# Patient Record
Sex: Female | Born: 1950
Health system: Southern US, Community
[De-identification: ages and names within clinical notes are randomized; demographics above are authoritative.]

## PROBLEM LIST (undated history)

## (undated) DIAGNOSIS — K649 Unspecified hemorrhoids: Secondary | ICD-10-CM

## (undated) DIAGNOSIS — K5790 Diverticulosis of intestine, part unspecified, without perforation or abscess without bleeding: Secondary | ICD-10-CM

## (undated) DIAGNOSIS — M5126 Other intervertebral disc displacement, lumbar region: Secondary | ICD-10-CM

## (undated) DIAGNOSIS — T7840XA Allergy, unspecified, initial encounter: Secondary | ICD-10-CM

## (undated) DIAGNOSIS — E559 Vitamin D deficiency, unspecified: Secondary | ICD-10-CM

## (undated) DIAGNOSIS — H269 Unspecified cataract: Secondary | ICD-10-CM

## (undated) DIAGNOSIS — K4090 Unilateral inguinal hernia, without obstruction or gangrene, not specified as recurrent: Secondary | ICD-10-CM

## (undated) DIAGNOSIS — F419 Anxiety disorder, unspecified: Secondary | ICD-10-CM

## (undated) DIAGNOSIS — IMO0002 Reserved for concepts with insufficient information to code with codable children: Secondary | ICD-10-CM

## (undated) DIAGNOSIS — R87619 Unspecified abnormal cytological findings in specimens from cervix uteri: Secondary | ICD-10-CM

## (undated) HISTORY — PX: BREAST ENHANCEMENT SURGERY: SHX7

## (undated) HISTORY — DX: Diverticulosis of intestine, part unspecified, without perforation or abscess without bleeding: K57.90

## (undated) HISTORY — PX: COLONOSCOPY: SHX174

## (undated) HISTORY — DX: Reserved for concepts with insufficient information to code with codable children: IMO0002

## (undated) HISTORY — PX: HEMORRHOID SURGERY: SHX153

## (undated) HISTORY — PX: INGUINAL HERNIA REPAIR: SUR1180

## (undated) HISTORY — DX: Unspecified abnormal cytological findings in specimens from cervix uteri: R87.619

## (undated) HISTORY — PX: CRYOTHERAPY: SHX1416

## (undated) HISTORY — PX: WISDOM TOOTH EXTRACTION: SHX21

## (undated) HISTORY — DX: Unspecified cataract: H26.9

## (undated) HISTORY — DX: Anxiety disorder, unspecified: F41.9

## (undated) HISTORY — DX: Other intervertebral disc displacement, lumbar region: M51.26

## (undated) HISTORY — PX: BUNIONECTOMY: SHX129

## (undated) HISTORY — DX: Vitamin D deficiency, unspecified: E55.9

## (undated) HISTORY — PX: TONSILLECTOMY: SUR1361

## (undated) HISTORY — DX: Allergy, unspecified, initial encounter: T78.40XA

---

## 1998-03-07 ENCOUNTER — Ambulatory Visit (HOSPITAL_BASED_OUTPATIENT_CLINIC_OR_DEPARTMENT_OTHER): Admission: RE | Admit: 1998-03-07 | Discharge: 1998-03-07 | Payer: Self-pay | Admitting: Orthopedic Surgery

## 1998-03-25 HISTORY — PX: BLEPHAROPLASTY: SUR158

## 2002-12-02 ENCOUNTER — Encounter (INDEPENDENT_AMBULATORY_CARE_PROVIDER_SITE_OTHER): Payer: Self-pay

## 2002-12-02 ENCOUNTER — Ambulatory Visit (HOSPITAL_COMMUNITY): Admission: RE | Admit: 2002-12-02 | Discharge: 2002-12-02 | Payer: Self-pay | Admitting: Surgery

## 2007-12-29 ENCOUNTER — Other Ambulatory Visit: Admission: RE | Admit: 2007-12-29 | Discharge: 2007-12-29 | Payer: Self-pay | Admitting: Obstetrics and Gynecology

## 2008-06-14 ENCOUNTER — Telehealth: Payer: Self-pay | Admitting: Internal Medicine

## 2008-06-14 DIAGNOSIS — K644 Residual hemorrhoidal skin tags: Secondary | ICD-10-CM | POA: Insufficient documentation

## 2008-06-23 ENCOUNTER — Encounter (INDEPENDENT_AMBULATORY_CARE_PROVIDER_SITE_OTHER): Payer: Self-pay | Admitting: General Surgery

## 2008-06-23 ENCOUNTER — Ambulatory Visit (HOSPITAL_BASED_OUTPATIENT_CLINIC_OR_DEPARTMENT_OTHER): Admission: RE | Admit: 2008-06-23 | Discharge: 2008-06-23 | Payer: Self-pay | Admitting: General Surgery

## 2008-06-24 ENCOUNTER — Ambulatory Visit (HOSPITAL_COMMUNITY): Admission: RE | Admit: 2008-06-24 | Discharge: 2008-06-24 | Payer: Self-pay | Admitting: General Surgery

## 2009-03-31 ENCOUNTER — Encounter (INDEPENDENT_AMBULATORY_CARE_PROVIDER_SITE_OTHER): Payer: Self-pay | Admitting: *Deleted

## 2009-05-26 ENCOUNTER — Encounter: Admission: RE | Admit: 2009-05-26 | Discharge: 2009-05-26 | Payer: Self-pay | Admitting: Chiropractic Medicine

## 2009-10-12 ENCOUNTER — Encounter: Payer: Self-pay | Admitting: Internal Medicine

## 2009-11-22 ENCOUNTER — Encounter (INDEPENDENT_AMBULATORY_CARE_PROVIDER_SITE_OTHER): Payer: Self-pay | Admitting: *Deleted

## 2009-11-22 ENCOUNTER — Ambulatory Visit: Payer: Self-pay | Admitting: Internal Medicine

## 2009-12-29 ENCOUNTER — Ambulatory Visit: Payer: Self-pay | Admitting: Internal Medicine

## 2010-04-24 NOTE — Letter (Signed)
Summary: Colonoscopy Letter  [facility]  [address] [place], [place] [address]   Phone: [phone]  Fax: [phone]      March 31, 2009 MRN: [id number]   [facility]  [address] [place], [place]  [address]   Dear Ms. Coulthard ,   According to your medical record, it is time for you to schedule a Colonoscopy. The American Cancer Society recommends this procedure as a method to detect early colon cancer. Patients with a family history of colon cancer, or a personal history of colon polyps or inflammatory bowel disease are at increased risk.  This letter has beeen generated based on the recommendations made at the time of your procedure. If you feel that in your particular situation this may no longer apply, please contact our office.  Please call our office at [phone] to schedule this appointment or to update your records at your earliest convenience.  Thank you for cooperating with [name] to provide you with the very best care possible.   Sincerely,  Hedwig Morton. Juanda Chance, M.D.  [facility] Gastroenterology Division [phone]

## 2010-04-24 NOTE — Miscellaneous (Signed)
Summary: [facility] Previsit/prep  Clinical Lists Changes  Medications: Added new medication of MOVIPREP 100 GM  SOLR (PEG-KCL-NACL-NASULF-NA ASC-C) As per prep instructions. - Signed Rx of MOVIPREP 100 GM  SOLR (PEG-KCL-NACL-NASULF-NA ASC-C) As per prep instructions.;  #1 x 0;  Signed;  Entered by: Wyona Almas RN;  Authorized by: Hart Carwin MD;  Method used: Electronically to [facility] # [phone]*, [address], [place], [place]  [address], Ph: [phone], Fax: [phone] Observations: Added new observation of NKA: T (11/22/2009 7:52)    Prescriptions: MOVIPREP 100 GM  SOLR (PEG-KCL-NACL-NASULF-NA ASC-C) As per prep instructions.  #1 x 0   Entered by:   Wyona Almas RN   Authorized by:   Hart Carwin MD   Signed by:   Wyona Almas RN on 11/22/2009   Method used:   Electronically to        [facility] # [phone]* (retail)       [address]       [place], [place]  [address]       Ph: [phone]       Fax: [phone]   RxID:   [id number]

## 2010-04-24 NOTE — Procedures (Signed)
Summary: Colonoscopy  Patient: [name] Note: All result statuses are Final unless otherwise noted.  Tests: (1) Colonoscopy (COL)   COL Colonoscopy           DONE     [facility]     [address]     [place], [place]  [address]           COLONOSCOPY PROCEDURE REPORT           PATIENT:  Tammy Porter, Tammy Porter  MR#:  [id number]     BIRTHDATE:  1950-08-19, 59 yrs. old  GENDER:  female     ENDOSCOPIST:  Iva Boop, MD, [facility]           PROCEDURE DATE:  12/29/2009     PROCEDURE:  Colonoscopy [address]     ASA CLASS:  Class I     INDICATIONS:  Routine Risk Screening     MEDICATIONS:   Fentanyl 50 mcg IV, Versed 5 mg           DESCRIPTION OF PROCEDURE:   After the risks benefits and     alternatives of the procedure were thoroughly explained, informed     consent was obtained.  Digital rectal exam was performed and     revealed no abnormalities.   The LB 180AL [id number] endoscope was     introduced through the anus and advanced to the cecum, which was     identified by both the appendix and ileocecal valve, without     limitations.  The quality of the prep was excellent, using     MoviPrep.  The instrument was then slowly withdrawn as the colon     was fully examined. Insertion: 5:08 minutes Withdrawal: 12:10     minutes     <<PROCEDUREIMAGES>>           FINDINGS:  Moderate diverticulosis was found in the sigmoid colon.     This was otherwise a normal examination of the colon.     Retroflexed views in the rectum revealed no abnormalities.    The     scope was then withdrawn from the patient and the procedure     completed.           COMPLICATIONS:  None     ENDOSCOPIC IMPRESSION:     1) Moderate diverticulosis in the sigmoid colon     2) Otherwise normal examination, excellent prep           REPEAT EXAM:  In 10 year(s) for routine screening colonoscopy.           Iva Boop, MD, [facility]           CC:  The Patient and Leota Sauers, NP [facility]      Healthcare)           n.     Rosalie Doctor:   Iva Boop at 12/29/2009 10:29 AM           Lavell Islam, [id number]  Note: An exclamation mark (!) indicates a result that was not dispersed into the flowsheet. Document Creation Date: 12/29/2009 10:30 AM _______________________________________________________________________  (1) Order result status: Final Collection or observation date-time: 12/29/2009 10:21 Requested date-time:  Receipt date-time:  Reported date-time:  Referring Physician:   Ordering Physician: Stan Head [phone]) Specimen Source:  Source: Launa Grill Order Number: [id number] Lab site:   Appended Document: Colonoscopy    Clinical Lists Changes  Observations: Added new observation of COLONNXTDUE: 12/2019 (12/29/2009 10:49)

## 2010-04-24 NOTE — Letter (Signed)
Summary: [facility] Instructions  [facility]  [address] [place], [place] [address]   Phone: [phone]  Fax: [phone]       Titiana Cambre    04-17-1950    MRN: [id number]        Procedure Day Dorna Bloom:  Farrell Ours  12/29/09     Arrival Time:  1:30PM     Procedure Time:  2:30PM     Location of Procedure:                    _X _  [facility] (4th Floor)                      PREPARATION FOR COLONOSCOPY WITH MOVIPREP   Starting 5 days prior to your procedure 12/24/09 do not eat nuts, seeds, popcorn, corn, beans, peas,  salads, or any raw vegetables.  Do not take any fiber supplements (e.g. Metamucil, Citrucel, and Benefiber).  THE DAY BEFORE YOUR PROCEDURE         DATE: 12/28/09 DAY: THURSDAY  1.  Drink clear liquids the entire day-NO SOLID FOOD  2.  Do not drink anything colored red or purple.  Avoid juices with pulp.  No orange juice.  3.  Drink at least 64 oz. (8 glasses) of fluid/clear liquids during the day to prevent dehydration and help the prep work efficiently.  CLEAR LIQUIDS INCLUDE: Water Jello Ice Popsicles Tea (sugar ok, no milk/cream) Powdered fruit flavored drinks Coffee (sugar ok, no milk/cream) Gatorade Juice: apple, white grape, white cranberry  Lemonade Clear bullion, consomm, broth Carbonated beverages (any kind) Strained chicken noodle soup Hard Candy                             4.  In the morning, mix first dose of MoviPrep solution:    Empty 1 Pouch A and 1 Pouch B into the disposable container    Add lukewarm drinking water to the top line of the container. Mix to dissolve    Refrigerate (mixed solution should be used within 24 hrs)  5.  Begin drinking the prep at 5:00 p.m. The MoviPrep container is divided by 4 marks.   Every 15 minutes drink the solution down to the next mark (approximately 8 oz) until the full liter is complete.   6.  Follow completed prep with 16 oz of clear liquid of your choice (Nothing red  or purple).  Continue to drink clear liquids until bedtime.  7.  Before going to bed, mix second dose of MoviPrep solution:    Empty 1 Pouch A and 1 Pouch B into the disposable container    Add lukewarm drinking water to the top line of the container. Mix to dissolve    Refrigerate  THE DAY OF YOUR PROCEDURE      DATE: 12/29/09  DAY: FRIDAY  Beginning at 9:30AM (5 hours before procedure):         1. Every 15 minutes, drink the solution down to the next mark (approx 8 oz) until the full liter is complete.  2. Follow completed prep with 16 oz. of clear liquid of your choice.    3. You may drink clear liquids until 12:30PM (2 HOURS BEFORE PROCEDURE).   MEDICATION INSTRUCTIONS  Unless otherwise instructed, you should take regular prescription medications with a small sip of water   as early as possible the morning of your procedure.  OTHER INSTRUCTIONS  You will need a responsible adult at least 62 years of age to accompany you and drive you home.   This person must remain in the waiting room during your procedure.  Wear loose fitting clothing that is easily removed.  Leave jewelry and other valuables at home.  However, you may wish to bring a book to read or  an iPod/MP3 player to listen to music as you wait for your procedure to start.  Remove all body piercing jewelry and leave at home.  Total time from sign-in until discharge is approximately 2-3 hours.  You should go home directly after your procedure and rest.  You can resume normal activities the  day after your procedure.  The day of your procedure you should not:   Drive   Make legal decisions   Operate machinery   Drink alcohol   Return to work  You will receive specific instructions about eating, activities and medications before you leave.    The above instructions have been reviewed and explained to me by   .sign    I fully understand and can verbalize these instructions  _____________________________ Date _________

## 2010-04-24 NOTE — Letter (Signed)
Summary: Previsit letter  [facility]  [address] [place], [place] [address]   Phone: [phone]  Fax: [phone]       10/12/2009 MRN: [id number]  Tammy Porter [address] [place], [place]  [address]  [place]  Dear Ms. Blough,  Welcome to the Gastroenterology Division at [facility]    You are scheduled to see a nurse for your pre-procedure visit on 11-21-09 at 11AM on the 3rd floor at [facility], [address]  We ask that you try to arrive at our office 15 minutes prior to your appointment time to allow for check-in.  Your nurse visit will consist of discussing your medical and surgical history, your immediate family medical history, and your medications.    Please bring a complete list of all your medications or, if you prefer, bring the medication bottles and we will list them.  We will need to be aware of both prescribed and over the counter drugs.  We will need to know exact dosage information as well.  If you are on blood thinners (Coumadin, Plavix, Aggrenox, Ticlid, etc.) please call our office today/prior to your appointment, as we need to consult with your physician about holding your medication.   Please be prepared to read and sign documents such as consent forms, a financial agreement, and acknowledgement forms.  If necessary, and with your consent, a friend or relative is welcome to sit-in on the nurse visit with you.  Please bring your insurance card so that we may make a copy of it.  If your insurance requires a referral to see a specialist, please bring your referral form from your primary care physician.  No co-pay is required for this nurse visit.     If you cannot keep your appointment, please call [phone] to cancel or reschedule prior to your appointment date.  This allows [name] the opportunity to schedule an appointment for another patient in need of care.    Thank you for choosing [facility] for your medical needs.   We appreciate the opportunity to care for you.  Please visit [name] at our website  to learn more about our practice.                     Sincerely.                                                                                                                   The Gastroenterology Division

## 2010-07-05 LAB — DIFFERENTIAL
Basophils Absolute: 0 10*3/uL (ref 0.0–0.1)
Eosinophils Absolute: 0 10*3/uL (ref 0.0–0.7)
Monocytes Relative: 12 % (ref 3–12)

## 2010-07-05 LAB — CBC
HCT: 43.1 % (ref 36.0–46.0)
MCHC: 34 g/dL (ref 30.0–36.0)
Platelets: 238 10*3/uL (ref 150–400)
RDW: 12.5 % (ref 11.5–15.5)

## 2010-07-05 LAB — COMPREHENSIVE METABOLIC PANEL
AST: 23 U/L (ref 0–37)
BUN: 10 mg/dL (ref 6–23)
Calcium: 9.3 mg/dL (ref 8.4–10.5)
Chloride: 101 mEq/L (ref 96–112)
Creatinine, Ser: 0.73 mg/dL (ref 0.4–1.2)
GFR calc non Af Amer: >60 mL/min (ref >60)
Glucose, Bld: 102 mg/dL — ABNORMAL HIGH (ref 70–99)
Sodium: 141 mEq/L (ref 135–145)

## 2010-08-07 NOTE — Op Note (Signed)
NAMEEmmarose, Tammy Porter                  ACCOUNT NO.:  [id number]   MEDICAL RECORD NO.:  [id number]          PATIENT TYPE:  AMB   LOCATION:  [facility]                         FACILITY:  [facility]   PHYSICIAN:  Anselm Pancoast. Weatherly, M.D.DATE OF BIRTH:  16-Apr-1950   DATE OF PROCEDURE:  06/23/2008  DATE OF DISCHARGE:                               OPERATIVE REPORT   PREOPERATIVE DIAGNOSIS:  Internal and external hemorrhoids, significant.   OPERATION:  Internal and external hemorrhoidectomy, general anesthesia,  lithotomy position.   SURGEON:  Anselm Pancoast. Zachery Dakins, M.D.   HISTORY:  Tammy Porter is a 60 year old Caucasian female who is quite  physically active.  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  She said she has had problems with hemorrhoids nearly all her life.  Actually had a formal hemorrhoidectomy when she was about 60 years old  and then has had problems with bleeding, swelling, etc., and she was  referred over to our office by Dr. Lina Sar.  Patient had a  colonoscopy probably about 4 to 5 years ago that showed no evidence of  any abnormality, and Dr. Juanda Chance felt that she does not need a repeat  colonoscopy for about 5 years.  She has significant hemorrhoids,  however, in all quadrants, and when she strains, she prolapses.  She  uses an estrogen replacement and I recommended that we do a formal  internal/external hemorrhoidectomy.  She can tighten up on her  sphincter, but I was impressed with the degree of external hemorrhoids,  in addition to that when she strains, she prolapsed internal hemorrhoids  in all 3 quadrants.   As far as on prep, she had a bottle of mag citrate yesterday afternoon  and said she was perfectly cleaned out.  Later when I did her procto, I  agree she was very cleaned out.  The patient was taken back by the  anesthesiologist, endotracheal tube, positioned with hip in the  Yellofin  stirrups and slipped down on the table so her buttocks is easily  visualized.  In this position, she has hemorrhoid engorgement in all 3  quadrants, prolapsing in all 3 quadrants, and I prepped her after the  procto to 20 cm.  I saw no abnormalities with the exception of her  hemorrhoids, and then we draped her in a sterile manner.   First, I anesthetized the perianal area of about 20 cc of 0.5% Marcaine  with adrenaline and then draped her in a sterile manner.  The anterior  quadrant was the smaller of the 3, and I elevated the external area off  the sphincter, kind of elevated the internal hemorrhoid off the  sphincter and then used a Bowie clamp under it.  The little edges of the  external area were sutured with 3-0 chromic, and then under the Bowie  clamp, I sutured with 2-0 chromic a couple of U stitches, and then  starting at the apex, suture-tied the superior hemorrhoid and removed  the clamp and then  sutured the little suture line of a running 2-0  chromic.  The inner anal area actually closed with a running 2-0, and  then I sort of T'd the external portion so that the suture line does not  go up laterally, since she has a very thin perineal body area.  I put a  few little sutures of 3-0 chromic, going more anteriorly in addition to  the running suture that kind of goes a little more to the anterolateral  area.   Next, the larger of the hemorrhoids was the right posterolateral, and  this, the external hemorrhoid was relieved.  The hemostasis was a little  bit with cautery and then suturing with a 3-0 chromic.  Then the  hemorrhoid was elevated off of the internal sphincter area and then  underclamped with a Bowie.  Then I gained about 3 sutures, U stitches of  2-0 chromic, and then suturing starting at the apex with a running 2-0  chromic for good hemostasis and closure of the suture line.  This area,  I continued running the suture right on out externally with the  2-0  chromic.  Next, the left lateral, which was also a large one, was  treated in a similar manner, removing the external hemorrhoid, suturing  the external hemorrhoids with 3-0, elevating it from the sphincter, then  under-clamping with the Bowie, removing the hemorrhoids, and then the U  stitches of 2-0 chromic and then starting at the apex, closing the  suture line with a running 2-0 chromic.  The external portion was  closed.  I kind of T'd this again like I did the anterior one, and put  an extra few little sutures.  I inspected all suture lines.  I had  placed an little extra figure-of-eight suture internally on the left  lateral.  It was one of the largest, if not the largest.  After the  suture lines had been inspected with an anoscope, I put 5% Xylocaine  ointment within the anal canal, and opened a 4 x 4, and there was about  a half of a gauze 4 x 4  that is coming out of the anus to kind of hold  the Xylocaine ointment in immediately postoperatively.   I checked into the vagina at the completion, and again, she really has a  very minimal perineal body.  I think she has had 2 children.  Whether  she had a significant episiotomy or exactly why there was so little bulk  there, I am not sure.  I am sure that has been contributing to her  problems with kind of a lifelong hemorrhoids.  The procedure terminated.  We put 4x4's and an ABD and the little stretch panties on.   I talked with her friend, Weston Anna, who is going to be with her  tonight, about soaking in the bathtub this evening, removing the little  gauze, and then using the Xylocaine ointment plus pain medication.  I  recommended liquids tonight.  She can have real food tomorrow.  She is  so clean from her bowel prep, I do not think it is necessary to start  the laxatives until probably Sunday or at least Saturday p.m.      Anselm Pancoast. Zachery Dakins, M.D.  Electronically Signed     WJW/MEDQ  D:  06/23/2008  T:   06/23/2008  Job:  [id number]   cc:   Hedwig Morton. Juanda Chance, MD  [address]  [place]  [place] [address]

## 2010-08-10 NOTE — Op Note (Signed)
NAMEJamyrah, Tammy Porter                              ACCOUNT NO.:  [id number]   MEDICAL RECORD NO.:  [id number]                   PATIENT TYPE:  AMB   LOCATION:  DAY                                  FACILITY:  [facility]   PHYSICIAN:  Sandria Bales. Ezzard Standing, M.D.               DATE OF BIRTH:  06-May-1950   DATE OF PROCEDURE:  12/02/2002  DATE OF DISCHARGE:                                 OPERATIVE REPORT   PREOPERATIVE DIAGNOSIS:  Left inguinal hernia.   POSTOPERATIVE DIAGNOSIS:  Indirect left inguinal hernia.   PROCEDURE:  Laparoscopic converted to open left inguinal hernia repair with  mesh.   SURGEON:  Sandria Bales. Ezzard Standing, M.D.   ANESTHESIA:  General endotracheal.   ESTIMATED BLOOD LOSS:  30 mL   DRAINS:  None.   INDICATIONS FOR PROCEDURE:  Tammy Porter is a 60 year old white female who has  a symptomatic left inguinal hernia and comes for repair of this hernia. I  discussed with her the indications and potential complications of  laparoscopic hernia repair. The potential complications included but are not  limited to bleeding, infection, nerve injury, recurrence of the hernia and  the possibility of converting to an open surgery.   DESCRIPTION OF PROCEDURE:  The patient placed in the supine position, both  her arms tucked, Foley catheter in place. She is given a general  endotracheal anesthetic supervised by Jenelle Mages. Fortune, M.D. and 1 g of  Ancef at the initiation of the procedure. Her lower abdomen is prepped with  Betadine solution and sterilely draped. I started with an infraumbilical  incision made with sharp dissection and carried down to the rectus abdominis  fascia. I went to the left of the rectus abdominis muscle, went through the  anterior fascia, retracted the rectus abdominis muscle anteriorly and placed  the preperitoneal balloon into the preperitoneal space and insufflated this  under direct visualization. Insufflating the balloon, there was a tear along  the left side of  the peritoneum. When I removed the balloon, she had a rent  which was probably about 7 or 8 cm in length. I thought it was impractical  to try to repair this and proceed with a laparoscopic hernia but I did not  get a good balloon distention and that she would be best served by  proceeding with just an open inguinal hernia repair. I was able to pass the  scope into the peritoneal cavity. I wanted to make sure there was no other  bowel injury or other problems. I then withdrew the scope and started a left  inguinal incision.   The left inguinal incision went down to the external oblique fascia which I  opened. I identified what looked like an ilioinguinal nerve which I spared  during the dissection. She had a sac which protruded out 7 or 8 cm, I took  the round ligament, removed the sac.  I opened the sac and introduced my  finger into the peritoneal cavity. I tried to fill this rent just to feel  what it felt like. Again I thought it was impractical to try to close this  rent. I really think this will stick down without any trouble. I then  ligated the sac with a #0 chromic suture. I also ligated the round ligament  with a #0 chromic suture. I then cleaned off the inguinal floor and sewed in  a piece of mesh medially to the pubic tubercle, inferiorly to the shelving  edge of the inguinal ligaments and superiorly to transversalis fascia.   The patch that I put on was approximately 2 1/2 x 4 1/2 or 5 inches in size  and covered the inguinal floor completely. I then irrigated the wound and  closed the external oblique with interrupted 3-0 Vicryl sutures, irrigated  the wound and closed it the subcutaneous area with 3-0 Vicryl suture, the  skin with a 5-0 Vicryl suture. I closed the umbilicus, the anterior defect  of the fascia with a #0 Vicryl suture and the skin with a 5-0 Vicryl suture,  painted both wounds tincture of Benzoin and Steri-Strips.   The patient tolerated the procedure well  and was transported to the recovery  room in good condition. Sponge and needle count were correct at the end of  the case.                                               Sandria Bales. Ezzard Standing, M.D.    DHN/MEDQ  D:  12/02/2002  T:  12/02/2002  Job:  [id number]   cc:   Rosalyn Gess. Norins, M.D. [facility]   Willa Rough, M.D.

## 2011-07-15 ENCOUNTER — Ambulatory Visit: Payer: Self-pay | Admitting: Internal Medicine

## 2011-10-05 ENCOUNTER — Emergency Department (HOSPITAL_COMMUNITY): Payer: BC Managed Care – PPO

## 2011-10-05 ENCOUNTER — Encounter (HOSPITAL_COMMUNITY): Payer: Self-pay

## 2011-10-05 ENCOUNTER — Emergency Department (HOSPITAL_COMMUNITY)
Admission: EM | Admit: 2011-10-05 | Discharge: 2011-10-05 | Disposition: A | Discharge status: Home / Self Care | Payer: BC Managed Care – PPO | Attending: Emergency Medicine | Admitting: Emergency Medicine

## 2011-10-05 DIAGNOSIS — R112 Nausea with vomiting, unspecified: Secondary | ICD-10-CM | POA: Insufficient documentation

## 2011-10-05 DIAGNOSIS — R1013 Epigastric pain: Secondary | ICD-10-CM

## 2011-10-05 DIAGNOSIS — R079 Chest pain, unspecified: Secondary | ICD-10-CM | POA: Insufficient documentation

## 2011-10-05 HISTORY — DX: Unilateral inguinal hernia, without obstruction or gangrene, not specified as recurrent: K40.90

## 2011-10-05 HISTORY — DX: Unspecified hemorrhoids: K64.9

## 2011-10-05 LAB — COMPREHENSIVE METABOLIC PANEL
Albumin: 4.2 g/dL (ref 3.5–5.2)
Alkaline Phosphatase: 47 U/L (ref 39–117)
BUN: 13 mg/dL (ref 6–23)
Calcium: 9.2 mg/dL (ref 8.4–10.5)
Chloride: 98 mEq/L (ref 96–112)
Creatinine, Ser: 0.66 mg/dL (ref 0.50–1.10)
Glucose, Bld: 133 mg/dL — ABNORMAL HIGH (ref 70–99)
Sodium: 134 mEq/L — ABNORMAL LOW (ref 135–145)
Total Protein: 7 g/dL (ref 6.0–8.3)

## 2011-10-05 LAB — CBC WITH DIFFERENTIAL/PLATELET
Basophils Absolute: 0 K/uL (ref 0.0–0.1)
Basophils Relative: 0 % (ref 0–1)
Eosinophils Absolute: 0 10*3/uL (ref 0.0–0.7)
Eosinophils Relative: 0 % (ref 0–5)
HCT: 44.2 % (ref 36.0–46.0)
Hemoglobin: 15.7 g/dL — ABNORMAL HIGH (ref 12.0–15.0)
Lymphocytes Relative: 9 % — ABNORMAL LOW (ref 12–46)
Lymphs Abs: 0.7 K/uL (ref 0.7–4.0)
MCH: 32.7 pg (ref 26.0–34.0)
MCHC: 35.5 g/dL (ref 30.0–36.0)
MCV: 92.1 fL (ref 78.0–100.0)
Monocytes Absolute: 0.4 10*3/uL (ref 0.1–1.0)
Monocytes Relative: 5 % (ref 3–12)
Neutro Abs: 6.9 10*3/uL (ref 1.7–7.7)
Neutrophils Relative %: 85 % — ABNORMAL HIGH (ref 43–77)
Platelets: 201 10*3/uL (ref 150–400)
RBC: 4.8 MIL/uL (ref 3.87–5.11)
RDW: 12.5 % (ref 11.5–15.5)
WBC: 8.1 K/uL (ref 4.0–10.5)

## 2011-10-05 LAB — COMPREHENSIVE METABOLIC PANEL WITH GFR
ALT: 10 U/L (ref 0–35)
AST: 23 U/L (ref 0–37)
CO2: 24 meq/L (ref 19–32)
GFR calc Af Amer: >90 mL/min (ref >90)
GFR calc non Af Amer: >90 mL/min (ref >90)
Potassium: 4 meq/L (ref 3.5–5.1)
Total Bilirubin: 0.7 mg/dL (ref 0.3–1.2)

## 2011-10-05 LAB — LIPASE, BLOOD: Lipase: 73 U/L — ABNORMAL HIGH (ref 11–59)

## 2011-10-05 LAB — TROPONIN I: Troponin I: <0.3 ng/mL (ref <0.30)

## 2011-10-05 MED ORDER — PANTOPRAZOLE SODIUM 40 MG IV SOLR
40.0000 mg | Freq: Once | INTRAVENOUS | Status: AC
  Administered 2011-10-05: 40 mg via INTRAVENOUS
  Filled 2011-10-05: qty 40

## 2011-10-05 MED ORDER — ONDANSETRON HCL 4 MG/2ML IJ SOLN
4.0000 mg | Freq: Once | INTRAMUSCULAR | Status: AC
  Administered 2011-10-05: 4 mg via INTRAVENOUS
  Filled 2011-10-05: qty 2

## 2011-10-05 MED ORDER — HYDROMORPHONE HCL PF 1 MG/ML IJ SOLN
1.0000 mg | Freq: Once | INTRAMUSCULAR | Status: AC
  Administered 2011-10-05: 1 mg via INTRAVENOUS
  Filled 2011-10-05: qty 1

## 2011-10-05 MED ORDER — SODIUM CHLORIDE 0.9 % IV BOLUS (SEPSIS)
500.0000 mL | Freq: Once | INTRAVENOUS | Status: AC
  Administered 2011-10-05: 10:00:00 via INTRAVENOUS

## 2011-10-05 NOTE — ED Notes (Signed)
Dr. Oletta Lamas back at bedside.

## 2011-10-05 NOTE — ED Notes (Signed)
Pt dropping sats to lower 90s after receiving Dilaudid. Placed on 2 L/[place] oxygen

## 2011-10-05 NOTE — ED Notes (Addendum)
Pt sts she woke up at 0300 with sharp, stabbing pain to her left upper quadrant. Vomited with pain. Denies any previous c/o chest pain.

## 2011-10-05 NOTE — Discharge Instructions (Signed)
 Abdominal Pain Abdominal pain can be caused by many things. Your caregiver decides the seriousness of your pain by an examination and possibly blood tests and X-rays. Many cases can be observed and treated at home. Most abdominal pain is not caused by a disease and will probably improve without treatment. However, in many cases, more time must pass before a clear cause of the pain can be found. Before that point, it may not be known if you need more testing, or if hospitalization or surgery is needed. HOME CARE INSTRUCTIONS   Do not take laxatives unless directed by your caregiver.   Take pain medicine only as directed by your caregiver.   Only take over-the-counter or prescription medicines for pain, discomfort, or fever as directed by your caregiver.   Try a clear liquid diet (broth, tea, or water) for as long as directed by your caregiver. Slowly move to a bland diet as tolerated.  SEEK IMMEDIATE MEDICAL CARE IF:   The pain does not go away.   You have a fever.   You keep throwing up (vomiting).   The pain is felt only in portions of the abdomen. Pain in the right side could possibly be appendicitis. In an adult, pain in the left lower portion of the abdomen could be colitis or diverticulitis.   You pass bloody or black tarry stools.  MAKE SURE YOU:   Understand these instructions.   Will watch your condition.   Will get help right away if you are not doing well or get worse.  Document Released: 12/19/2004 Document Revised: 02/28/2011 Document Reviewed: 10/28/2007 [facility] Patient Information 2012 [place], [place].

## 2011-10-05 NOTE — ED Provider Notes (Addendum)
History     CSN: [id number]  Arrival date & time 10/05/11  0934   First MD Initiated Contact with Patient 10/05/11 [phone]      Chief Complaint  Patient presents with  . Chest Pain    (Consider location/radiation/quality/duration/timing/severity/associated sxs/prior treatment) HPI Comments: Pt woke around 0300 with nausea, has had 3 episodes of vomiting, has now severe pain in upper epigastrium underneath her sternum that is reproduced with palpation or sudden movement.  She denies CP, SOB, pleurisy.  She and friends went out to eat dinner last night, she had seafood.  She went to urgent care this AM and told to come here.  She denies diarrhea, black stools.  Pain doesn't radiate to back, flank, chest.  No dysuria.  No leg problems.  She has h/o inguinal repair 10 years ago and hemorrhoidectomy about 4 years ago, otherwise is healthy.  She drinks 1-2 drinks daily and takes an aspirin or ibuprofen for arthritis approx daily.  At urgent care, her BP was high and HR was low.  Pt teaches pilates and yoga.    Patient is a 61 y.o. female presenting with chest pain. The history is provided by the patient.  Chest Pain Primary symptoms include abdominal pain, nausea and vomiting. Pertinent negatives for primary symptoms include no fever and no shortness of breath.     Past Medical History  Diagnosis Date  . Hemorrhoids   . Inguinal hernia     Past Surgical History  Procedure Date  . Hemorrhoid surgery     History reviewed. No pertinent family history.  History  Substance Use Topics  . Smoking status: Never Smoker   . Smokeless tobacco: Not on file  . Alcohol Use: Yes     occasionally    OB History    Grav Para Term Preterm Abortions TAB SAB Ect Mult Living                  Review of Systems  Constitutional: Negative for fever and chills.  Respiratory: Negative for shortness of breath.   Cardiovascular: Negative for chest pain.  Gastrointestinal: Positive for nausea,  vomiting and abdominal pain.  Musculoskeletal: Negative for back pain.  All other systems reviewed and are negative.    Allergies  Review of patient's allergies indicates no known allergies.  Home Medications   Current Outpatient Rx  Name Route Sig Dispense Refill  . CALCIUM CARBONATE PO Oral Take 1 tablet by mouth daily.    Marland Kitchen VITAMIN D PO Oral Take 1 tablet by mouth daily.    . OMEGA-3 FATTY ACIDS 1000 MG PO CAPS Oral Take 1 g by mouth daily.    . ADULT MULTIVITAMIN W/MINERALS CH Oral Take 1 tablet by mouth daily.    Marland Kitchen OVER THE COUNTER MEDICATION  Patient takes several other supplements, is unaware of names. Took all supplements yesterday.    Marland Kitchen PRESCRIPTION MEDICATION Oral Take 1 capsule by mouth daily. Special compound of progesterone and estriadol.      BP 122/68  Pulse 60  Temp 97.6 F (36.4 C) (Oral)  Resp 14  SpO2 100%  Physical Exam  Nursing note and vitals reviewed. Constitutional: She is oriented to person, place, and time. She appears well-developed and well-nourished.  HENT:  Head: Normocephalic and atraumatic.  Eyes: No scleral icterus.  Neck: Normal range of motion. Neck supple.  Cardiovascular: Regular rhythm, S1 normal and S2 normal.  Bradycardia present.  PMI is not displaced.   Pulmonary/Chest: Effort normal. No  respiratory distress. She has no wheezes. She has no rales.  Abdominal: Soft. Normal appearance. Bowel sounds are decreased. There is tenderness in the epigastric area. There is rebound and guarding. There is no CVA tenderness.  Neurological: She is alert and oriented to person, place, and time. She has normal strength. GCS eye subscore is 4. GCS verbal subscore is 5. GCS motor subscore is 6.  Skin: Skin is warm and dry. No rash noted. She is not diaphoretic. No pallor.  Psychiatric: Thought content normal. Her mood appears anxious. Cognition and memory are normal.    ED Course  Procedures (including critical care time)  Labs Reviewed  CBC WITH  DIFFERENTIAL - Abnormal; Notable for the following:    Hemoglobin 15.7 (*)     Neutrophils Relative 85 (*)     Lymphocytes Relative 9 (*)     All other components within normal limits  COMPREHENSIVE METABOLIC PANEL - Abnormal; Notable for the following:    Sodium 134 (*)     Glucose, Bld 133 (*)     All other components within normal limits  LIPASE, BLOOD - Abnormal; Notable for the following:    Lipase 73 (*)     All other components within normal limits  TROPONIN I  URINALYSIS, ROUTINE W REFLEX MICROSCOPIC   Dg Chest 2 View  10/05/2011  *RADIOLOGY REPORT*  Clinical Data: Epigastric pain.  CHEST - 2 VIEW  Comparison: None.  Findings: Mild cardiomegaly noted.  Mild scarring is seen at the lung bases.  No evidence of pulmonary infiltrate or edema.  No evidence of pleural effusion.  No mass or lymphadenopathy identified.  No evidence of free intraperitoneal on this exam.  the  IMPRESSION:  1.  Mild cardiomegaly and bibasilar scarring. 2.  No active lung disease or free intraperitoneal air.  Original Report Authenticated By: Danae Orleans, M.D.   US Abdomen Complete  10/05/2011  *RADIOLOGY REPORT*  Clinical Data:  Abdominal pain.  Nausea and vomiting.  ABDOMINAL ULTRASOUND COMPLETE  Comparison:  None.  Findings:  Gallbladder:  No gallstones, gallbladder wall thickening, or pericholecystic fluid.  Common Bile Duct:  Within normal limits in caliber. Measures 5 mm in diameter.  Liver: No focal mass lesion identified.  Within normal limits in parenchymal echogenicity.  IVC:  Appears normal.  Pancreas:  No abnormality identified.  Spleen:  Within normal limits in size and echotexture.  Right kidney:  Normal in size and parenchymal echogenicity.  No evidence of mass or hydronephrosis.  Left kidney:  Normal in size and parenchymal echogenicity.  No evidence of mass or hydronephrosis.  Abdominal Aorta:  No aneurysm identified.  Other:  Tiny left pleural effusion incidentally noted.  IMPRESSION:  1.  Negative  abdominal ultrasound. 2.  Tiny left pleural effusion incidentally noted.  Original Report Authenticated By: Danae Orleans, M.D.     1. Acute epigastric pain     RA sat is 100% and I interpret to be normal.  ECG at time 0944 shows sinus bradycardia at rate 49, poor r wave progression in V1-V3, borderline.  Very similar including rate to ECG from 12/02/02.   12:20 PM Pt's pain has completely resolved.  Lipase is minimally elevated, but not significantly, I think may be non specific or may be related to a biliary stone that has happened to pass.  She has no guard or rebound now, I think dilaudid would have worn off by now.  I have encouraged her to follow up with her PCP  for further discussion.  Will try PO's and if she can tolerate, will d/c home.  PT informed of marginally elevated lipase.  MDM  Pt is in sig pain, guarding and mild rebound tenderness.  Will get labs, CXR to r/o free air.  Will get U/S of RUQ, but CT may be needed as well.  IVF's, keep NPO, IV analgesics and antiemetics and will continue to monitor.          Gavin Pound. Oletta Lamas, MD 10/05/11 1223  Gavin Pound. Samarie Pinder, MD 10/05/11 1256

## 2011-10-05 NOTE — ED Notes (Signed)
MD back at bedside.

## 2011-10-05 NOTE — ED Notes (Signed)
Patient is NAD, denies any pain at present.

## 2011-10-05 NOTE — ED Notes (Signed)
Patient transported to Ultrasound by tech

## 2011-10-05 NOTE — ED Notes (Signed)
MD at bedside. 

## 2011-10-07 ENCOUNTER — Encounter: Payer: Self-pay | Admitting: *Deleted

## 2011-10-07 ENCOUNTER — Telehealth: Payer: Self-pay | Admitting: Internal Medicine

## 2011-10-07 ENCOUNTER — Encounter: Payer: Self-pay | Admitting: Internal Medicine

## 2011-10-07 NOTE — Telephone Encounter (Signed)
Patient has history with DB.  She will come in and see her tomorrow at 1:30

## 2011-10-08 ENCOUNTER — Ambulatory Visit (INDEPENDENT_AMBULATORY_CARE_PROVIDER_SITE_OTHER): Payer: BC Managed Care – PPO | Admitting: Internal Medicine

## 2011-10-08 ENCOUNTER — Encounter: Payer: Self-pay | Admitting: Internal Medicine

## 2011-10-08 VITALS — BP 100/60 | HR 60 | Ht 65.0 in | Wt 129.6 lb

## 2011-10-08 DIAGNOSIS — R1013 Epigastric pain: Secondary | ICD-10-CM

## 2011-10-08 NOTE — Patient Instructions (Addendum)
Follow up with Dr Juanda Chance as needed. CC: Dr Thayer Headings

## 2011-10-08 NOTE — Progress Notes (Signed)
Tammy Porter [birth year] MRN [id number]   History of Present Illness:  This is a 61 year old white female with episodes of severe subxiphoid chest pain which woke her up 3 days ago early in the morning and persisted. She went to the emergency room where she was given pain medication. Her cardiac evaluation was negative and her upper abdominal ultrasound was normal showing a normal common bile duct, gallbladder and small left pleural effusion. Her liver function tests were normal. Her lipase was slightly elevated at 73. She was treated in the emergency room and sent home but the pain recurred several hours later and she took a Zantac and Pepto-Bismol. She saw Dr Ronne Binning the next day and was started on Prilosec 20 mg twice a day with almost instant relief of the chest pain. She is feeling well today. She denies any pain, fever or nausea. 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 She is also taking Aleve. She has never had a similar pain before and there is no family history of gallbladder disease. She had a screening colonoscopy in 2001 and most recently in 2011 which showed moderately severe diverticulosis of the left colon.  Past Medical History  Diagnosis Date  . Hemorrhoids   . Inguinal hernia   . Vitamin d deficiency   . DDD (degenerative disc disease)    Past Surgical History  Procedure Date  . Hemorrhoid surgery   . Tonsillectomy   . Breast enhancement surgery     x 2  . Bunionectomy     right foot  . Blepharoplasty     reports that she has never smoked. She has never used smokeless tobacco. She reports that she drinks alcohol. She reports that she does not use illicit drugs. family history includes Leukemia in her father.  There is no history of Colon cancer. No Known Allergies      Review of Systems: Negative for dysphagia, odynophagia, history of reflux  The remainder of the 10 point ROS is negative except as outlined in  H&P   Physical Exam: General appearance  Well developed, in no distress. Eyes- non icteric. HEENT nontraumatic, normocephalic. Mouth no lesions, tongue papillated, no cheilosis. Neck supple without adenopathy, thyroid not enlarged, no carotid bruits, no JVD. Lungs Clear to auscultation bilaterally. Cor normal S1, normal S2, regular rhythm, no murmur,  quiet precordium. Abdomen: Minimal tenderness in subxiphoid area. No abnormal pulsations. No bruit. Liver edge at costal margin. Right and left upper quadrants unremarkable. No CVA tenderness. Rectal: Not done. Extremities no pedal edema. Skin no lesions. Neurological alert and oriented x 3. Psychological normal mood and affect.  Assessment and Plan:  Problem #1 Acute episode of subxiphoid chest pain relieved with PPIs. She has had a normal ultrasound of the gallbladder. I feel we are dealing with acute gastritis, esophagitis or esophageal spasm secondary to excess use of NSAID's. Although choledocholithiasis is a possibility, she had a normal-size common bile duct and normal liver function tests. The mild elevation of serum lipase is quite nonspecific and her physical exam is suggestive of esophageal abnormality or hiatal hernia. She will stop taking her anti-inflammatory medications and will continue on Prilosec twice a day for 1 week, then decrease to one a day dose. She would like to wait with a diagnostic upper endoscopy unless her pain recurs. In the future, she may continue Prilosec on a when necessary basis or take over-the-counter Zantac 150 mg. She will have to be careful from  now on about taking anti-inflammatory medications and she may consider using proton pump inhibitors to protect her stomach.  Problem #2 Colorectal screening. Her last colonoscopy was in 2011. A recall colonoscopy will be due in 2021.   10/08/2011 Lina Sar

## 2011-11-11 ENCOUNTER — Ambulatory Visit: Payer: BC Managed Care – PPO | Admitting: Internal Medicine

## 2012-11-30 ENCOUNTER — Telehealth: Payer: Self-pay | Admitting: *Deleted

## 2012-11-30 NOTE — Telephone Encounter (Signed)
Fax From: [facility] for Vitamin D 50,000 iu Last Refilled: 03/19/12 (AEX) # 26/ rfs x 1 year Aex Scheduled: 04/07/13 with Ms.Debboe  #90/1 rf's [facility].

## 2013-04-07 ENCOUNTER — Ambulatory Visit: Payer: Self-pay | Admitting: Certified Nurse Midwife

## 2013-04-16 ENCOUNTER — Encounter: Payer: Self-pay | Admitting: Certified Nurse Midwife

## 2013-04-19 ENCOUNTER — Encounter: Payer: Self-pay | Admitting: Certified Nurse Midwife

## 2013-04-19 ENCOUNTER — Ambulatory Visit (INDEPENDENT_AMBULATORY_CARE_PROVIDER_SITE_OTHER): Payer: Managed Care, Other (non HMO) | Admitting: Certified Nurse Midwife

## 2013-04-19 VITALS — BP 100/60 | HR 68 | Resp 16 | Ht 64.25 in | Wt 123.0 lb

## 2013-04-19 DIAGNOSIS — N63 Unspecified lump in unspecified breast: Secondary | ICD-10-CM

## 2013-04-19 DIAGNOSIS — N6310 Unspecified lump in the right breast, unspecified quadrant: Secondary | ICD-10-CM

## 2013-04-19 DIAGNOSIS — Z0000 Encounter for general adult medical examination without abnormal findings: Secondary | ICD-10-CM

## 2013-04-19 DIAGNOSIS — Z01419 Encounter for gynecological examination (general) (routine) without abnormal findings: Secondary | ICD-10-CM

## 2013-04-19 NOTE — Patient Instructions (Signed)

## 2013-04-19 NOTE — Progress Notes (Signed)
62 y.o. [id number] Single Caucasian Fe here for annual exam. Menopausal on HRT with  Dr Marina Gravel management. Denies vaginal bleeding or vaginal dryness. Patient also has established with PCP now. Dr Marina Gravel does all her labs also. Not sexually active. No health concerns today.  Patient's last menstrual period was 03/25/2001.          Sexually active: no  The current method of family planning is none and post menopausal status.    Exercising: yes  yoga & pilates Smoker:  no  Health Maintenance: Pap:  03-19-12 neg HPV HR neg MMG:  11/12 & u/s rt breast (silicone leakage) Colonoscopy:  2011 polyp noted 5 years BMD:   2010 normal TDaP:  2008 Labs: none Self breast exam: done occ   reports that she has quit smoking. She has never used smokeless tobacco. She reports that she drinks alcohol. She reports that she does not use illicit drugs.  Past Medical History  Diagnosis Date  . Hemorrhoids   . Inguinal hernia   . Vitamin D deficiency   . DDD (degenerative disc disease)   . Abnormal Pap smear of cervix     over 15 yrs ago    Past Surgical History  Procedure Laterality Date  . Hemorrhoid surgery    . Tonsillectomy    . Breast enhancement surgery      x 2  . Bunionectomy      right foot  . Blepharoplasty  2000  . Cryotherapy      over 15 yrs ago    Current Outpatient Prescriptions  Medication Sig Dispense Refill  . AMBULATORY NON FORMULARY MEDICATION Medication Name: Pyridoxal 1,2 &3 (replacing Vitamin C, E & B complex & Multivitamin) 1 tablet three times per week.      Marland Kitchen BIOTIN PO Take 10,000 mcg by mouth daily.      . ergocalciferol (VITAMIN D2) 50000 UNITS capsule Take 50,000 Units by mouth every 14 (fourteen) days.      Marland Kitchen PRESCRIPTION MEDICATION Take 1 capsule by mouth daily. Special compound of estriadiol/progesterone. .25-100mg       . UNABLE TO FIND daily. Serene (serotonin support)      . UNABLE TO FIND Carbon 98 BX (take 1 for 5 days       No current facility-administered  medications for this visit.    Family History  Problem Relation Age of Onset  . Leukemia Father   . Colon cancer Neg Hx   . Hypertension Mother   . COPD Mother     ROS:  Pertinent items are noted in HPI.  Otherwise, a comprehensive ROS was negative.  Exam:   BP 100/60  Pulse 68  Resp 16  Ht 5' 4.25" (1.632 m)  Wt 123 lb (55.792 kg)  BMI 20.95 kg/m2  LMP 03/25/2001 Height: 5' 4.25" (163.2 cm)  Ht Readings from Last 3 Encounters:  04/19/13 5' 4.25" (1.632 m)  10/08/11 5\' 5"  (1.651 m)    General appearance: alert, cooperative and appears stated age Head: Normocephalic, without obvious abnormality, atraumatic Neck: no adenopathy, supple, symmetrical, trachea midline and thyroid normal to inspection and palpation Lungs: clear to auscultation bilaterally Breasts: normal appearance, no masses or tenderness, No nipple retraction or dimpling, No nipple discharge or bleeding, No axillary or supraclavicular adenopathy left breast. Right breast at 12 o'clock 2 fb from areola, BB size, non tender, no nipple discharge or skin change. Previous area of loose silicone noted at 11 o'clock per mammogram/US. Heart: regular rate and rhythm Abdomen:  soft, non-tender; no masses,  no organomegaly Extremities: extremities normal, atraumatic, no cyanosis or edema Skin: Skin color, texture, turgor normal. No rashes or lesions Lymph nodes: Cervical, supraclavicular, and axillary nodes normal. No abnormal inguinal nodes palpated Neurologic: Grossly normal   Pelvic: External genitalia:  no lesions, bb size lymph node still noted in left inguinal area, previously noted for the past 4 years with no change.              Urethra:  normal appearing urethra with no masses, tenderness or lesions              Bartholin's and Skene's: normal                 Vagina: normal appearing vagina with normal color and discharge, no lesions              Cervix: normal, non tender              Pap taken: yes Bimanual  Exam:  Uterus:  normal size, contour, position, consistency, mobility, non-tender and anteverted              Adnexa: normal adnexa and no mass, fullness, tenderness               Rectovaginal: Confirms               Anus:  normal sphincter tone, no lesions  A:  Well Woman with normal exam  Menopausal on HRT with Dr.Wanick  Right breast mass ? Silicone leakage again  BB size inguinal lymph node previously present with no change in 4 years.   P:   Reviewed health and wellness pertinent to exam  Aware of need to evaluate if vaginal bleeding  Discussed findings, patient had not noted on breast exam. Discussed need for diagnostic mammogram and [name], routine screening due. Will schedule, patient agreeable  Patient has to have labs with Dr. Marina Gravel 04/19/13, will have CBC with diff done again and advise of results, declined labs here.  Pap smear as per guidelines   Mammogram yearly stressed pap smear taken today with HPVHR  counseled on breast self exam, mammography screening, adequate intake of calcium and vitamin D, Kegel's exercises  return annually or prn  An After Visit Summary was printed and given to the patient.

## 2013-04-20 ENCOUNTER — Telehealth: Payer: Self-pay | Admitting: Emergency Medicine

## 2013-04-20 NOTE — Telephone Encounter (Signed)
Phone call to patient to advise  R Diagnostic Mammogram with [name] for R Breast Mass at 3159, Hx silicone implants.  Paper order faxed.  Appointment 04/21/13 at 1:15. Patient agreeable to time/date/location.   Mammogram hold.

## 2013-04-21 LAB — IPS PAP TEST WITH HPV

## 2013-04-21 NOTE — Progress Notes (Signed)
Reviewed personally.  M. Suzanne Lyn Joens, MD.  

## 2013-06-08 ENCOUNTER — Other Ambulatory Visit: Payer: Self-pay | Admitting: Certified Nurse Midwife

## 2013-06-08 NOTE — Telephone Encounter (Signed)
Last AEX 04/19/2013 Pt declined labs.  Patient has to have labs with Dr. Marina Gravel 04/19/13  - Per Ms Jackelyn Poling 04/19/13 Note

## 2013-06-10 ENCOUNTER — Encounter: Payer: Self-pay | Admitting: Certified Nurse Midwife

## 2014-01-24 ENCOUNTER — Encounter: Payer: Self-pay | Admitting: Certified Nurse Midwife

## 2014-05-06 ENCOUNTER — Ambulatory Visit (INDEPENDENT_AMBULATORY_CARE_PROVIDER_SITE_OTHER): Payer: Medicare Other | Admitting: Certified Nurse Midwife

## 2014-05-06 ENCOUNTER — Encounter: Payer: Self-pay | Admitting: Certified Nurse Midwife

## 2014-05-06 VITALS — BP 100/62 | HR 68 | Resp 16 | Ht 64.25 in | Wt 123.0 lb

## 2014-05-06 DIAGNOSIS — R591 Generalized enlarged lymph nodes: Secondary | ICD-10-CM | POA: Diagnosis not present

## 2014-05-06 DIAGNOSIS — Z124 Encounter for screening for malignant neoplasm of cervix: Secondary | ICD-10-CM

## 2014-05-06 DIAGNOSIS — Z01419 Encounter for gynecological examination (general) (routine) without abnormal findings: Secondary | ICD-10-CM | POA: Diagnosis not present

## 2014-05-06 DIAGNOSIS — R599 Enlarged lymph nodes, unspecified: Secondary | ICD-10-CM

## 2014-05-06 NOTE — Progress Notes (Signed)
Reviewed personally.  M. Suzanne Ifrah Vest, MD.  

## 2014-05-06 NOTE — Progress Notes (Signed)
64 y.o. G2P1011 Single  Caucasian Fe here for annual exam. Menopausal no HRT. Denies vaginal bleeding or vaginal dryness. Sees Dr Marina Gravel every 6 months for health and lab management. Feels great. No health issues today. 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 No change in groin lymph nodes on right per patient.  Patient's last menstrual period was 03/25/2001.          Sexually active: No.  The current method of family planning is post menopausal status.    Exercising: Yes.    yoga & pilates Smoker:  no  Health Maintenance: Pap:  04-19-13 neg HPV HR neg MMG:  04-21-13 mammo done 7 f/u rt breast birads 2:neg Colonoscopy:  2011 polyp 40yrs BMD:   2010 normal TDaP:  2008 Labs: pcp Self breast exam: done monthly   reports that she has quit smoking. She has never used smokeless tobacco. She reports that she drinks about 4.2 oz of alcohol per week. She reports that she does not use illicit drugs.  Past Medical History  Diagnosis Date  . Hemorrhoids   . Inguinal hernia   . Vitamin D deficiency   . DDD (degenerative disc disease)   . Abnormal Pap smear of cervix     over 15 yrs ago    Past Surgical History  Procedure Laterality Date  . Hemorrhoid surgery    . Tonsillectomy    . Breast enhancement surgery      x 2  . Bunionectomy      right foot  . Blepharoplasty  2000  . Cryotherapy      over 15 yrs ago    Current Outpatient Prescriptions  Medication Sig Dispense Refill  . AMBULATORY NON FORMULARY MEDICATION Medication Name: Pyridoxal 1,2 &3 (replacing Vitamin C, E & B complex & Multivitamin) 1 tablet three times per week.    Marland Kitchen BIOTIN PO Take 10,000 mcg by mouth daily.    . Cholecalciferol (VITAMIN D PO) Take by mouth every other day.    Marland Kitchen PRESCRIPTION MEDICATION Take 1 capsule by mouth daily. Special compound of estriadiol/progesterone. .25-100mg     . UNABLE TO FIND daily. Serene (serotonin support)    . UNABLE TO FIND Carbon 98 BX  (take 1 for 5 days     No current facility-administered medications for this visit.    Family History  Problem Relation Age of Onset  . Leukemia Father   . Colon cancer Neg Hx   . Hypertension Mother   . COPD Mother     ROS:  Pertinent items are noted in HPI.  Otherwise, a comprehensive ROS was negative.  Exam:   BP 100/62 mmHg  Pulse 68  Resp 16  Ht 5' 4.25" (1.632 m)  Wt 123 lb (55.792 kg)  BMI 20.95 kg/m2  LMP 03/25/2001 Height: 5' 4.25" (163.2 cm) Ht Readings from Last 3 Encounters:  05/06/14 5' 4.25" (1.632 m)  04/19/13 5' 4.25" (1.632 m)  10/08/11 5\' 5"  (1.651 m)    General appearance: alert, cooperative and appears stated age Head: Normocephalic, without obvious abnormality, atraumatic Neck: no adenopathy, supple, symmetrical, trachea midline and thyroid normal to inspection and palpation Lungs: clear to auscultation bilaterally Breasts: normal appearance, no masses or tenderness, No nipple retraction or dimpling, No nipple discharge or bleeding, No axillary or supraclavicular adenopathy Heart: regular rate and rhythm Abdomen: soft, non-tender; no masses,  no organomegaly Extremities: extremities normal, atraumatic, no cyanosis or edema Skin: Skin color, texture, turgor normal.  No rashes or lesions Lymph nodes: Cervical, supraclavicular, and axillary nodes normal. No abnormal inguinal nodes palpated Neurologic: Grossly normal   Pelvic: External genitalia:  no lesions              Urethra:  normal appearing urethra with no masses, tenderness or lesions              Bartholin's and Skene's: normal                 Vagina: normal appearing vagina with normal color and discharge, no lesions              Cervix: normal, non tender, no lesions              Pap taken: Yes.   Bimanual Exam:  Uterus:  normal size, contour, position, consistency, mobility, non-tender              Adnexa: normal adnexa and no mass, fullness, tenderness               Rectovaginal:  Confirms               Anus:  normal sphincter tone, no lesions  Chaperone present: Yes  A:  Well Woman with normal exam  Menopausal no HRT   Right breast area of loose silcone at 11 o'clock still palpated.  History of Abnormal pap smear with Cryo in 2006, normal paps since  P:   Reviewed health and wellness pertinent to exam  Pap smear taken today  pap smear return annually or prn  An After Visit Summary was printed and given to the patient.

## 2014-05-06 NOTE — Patient Instructions (Signed)

## 2014-05-07 LAB — CBC WITH DIFFERENTIAL/PLATELET
BASOS ABS: 0 10*3/uL (ref 0.0–0.1)
BASOS PCT: 0 % (ref 0–1)
EOS PCT: 1 % (ref 0–5)
Eosinophils Absolute: 0 10*3/uL (ref 0.0–0.7)
HCT: 42.8 % (ref 36.0–46.0)
Hemoglobin: 14.1 g/dL (ref 12.0–15.0)
Lymphocytes Relative: 45 % (ref 12–46)
Lymphs Abs: 2.1 10*3/uL (ref 0.7–4.0)
MCH: 31.3 pg (ref 26.0–34.0)
MCHC: 32.9 g/dL (ref 30.0–36.0)
MCV: 95.1 fL (ref 78.0–100.0)
MPV: 9.4 fL (ref 8.6–12.4)
Monocytes Absolute: 0.5 10*3/uL (ref 0.1–1.0)
Monocytes Relative: 10 % (ref 3–12)
Neutro Abs: 2.1 10*3/uL (ref 1.7–7.7)
Neutrophils Relative %: 44 % (ref 43–77)
PLATELETS: 222 10*3/uL (ref 150–400)
RBC: 4.5 MIL/uL (ref 3.87–5.11)
RDW: 13.8 % (ref 11.5–15.5)
WBC: 4.7 10*3/uL (ref 4.0–10.5)

## 2014-05-09 ENCOUNTER — Ambulatory Visit: Payer: Managed Care, Other (non HMO) | Admitting: Certified Nurse Midwife

## 2014-05-10 LAB — IPS PAP SMEAR ONLY

## 2014-09-06 ENCOUNTER — Encounter: Payer: Self-pay | Admitting: Internal Medicine

## 2014-09-19 ENCOUNTER — Other Ambulatory Visit: Payer: Self-pay

## 2015-04-27 ENCOUNTER — Other Ambulatory Visit: Payer: Self-pay | Admitting: Physical Medicine and Rehabilitation

## 2015-04-27 DIAGNOSIS — M2560 Stiffness of unspecified joint, not elsewhere classified: Secondary | ICD-10-CM

## 2015-04-27 DIAGNOSIS — M25511 Pain in right shoulder: Secondary | ICD-10-CM

## 2015-05-05 ENCOUNTER — Ambulatory Visit
Admission: RE | Admit: 2015-05-05 | Discharge: 2015-05-05 | Disposition: A | Discharge status: Home / Self Care | Payer: Medicare Other | Source: Ambulatory Visit | Attending: Physical Medicine and Rehabilitation | Admitting: Physical Medicine and Rehabilitation

## 2015-05-05 DIAGNOSIS — M25511 Pain in right shoulder: Secondary | ICD-10-CM

## 2015-05-05 DIAGNOSIS — M2560 Stiffness of unspecified joint, not elsewhere classified: Secondary | ICD-10-CM

## 2015-05-26 ENCOUNTER — Encounter: Payer: Self-pay | Admitting: Certified Nurse Midwife

## 2015-05-26 ENCOUNTER — Ambulatory Visit (INDEPENDENT_AMBULATORY_CARE_PROVIDER_SITE_OTHER): Payer: Medicare Other | Admitting: Certified Nurse Midwife

## 2015-05-26 VITALS — BP 96/60 | HR 68 | Resp 16 | Ht 64.25 in | Wt 123.0 lb

## 2015-05-26 DIAGNOSIS — Z01419 Encounter for gynecological examination (general) (routine) without abnormal findings: Secondary | ICD-10-CM

## 2015-05-26 DIAGNOSIS — Z0000 Encounter for general adult medical examination without abnormal findings: Secondary | ICD-10-CM | POA: Diagnosis not present

## 2015-05-26 DIAGNOSIS — Z124 Encounter for screening for malignant neoplasm of cervix: Secondary | ICD-10-CM

## 2015-05-26 LAB — HIV ANTIBODY (ROUTINE TESTING W REFLEX): HIV 1&2 Ab, 4th Generation: NONREACTIVE

## 2015-05-26 LAB — POCT URINALYSIS DIPSTICK
BILIRUBIN UA: NEGATIVE
GLUCOSE UA: NEGATIVE
Ketones, UA: NEGATIVE
Leukocytes, UA: NEGATIVE
NITRITE UA: NEGATIVE
PH UA: 5
Protein, UA: NEGATIVE
RBC UA: NEGATIVE
Urobilinogen, UA: NEGATIVE

## 2015-05-26 NOTE — Progress Notes (Signed)
65 y.o. G2P1011 Single  Caucasian Fe here for annual exam. Menopausal no vaginal bleeding. Denies vaginal dryness. 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 Having some rectal bleeding frequently with hemorrhoids, some incontinence of stool due surgery years ago for hemorrhoids. Has mammogram scheduled. Patient still has inguinal lymph nodes on left( known enlargement for years), but hard to feel now. No change noted in the past year. Sees Dr Marina Gravel for labs and exam yearly. Labs normal per patient. No other health issues today.  Patient's last menstrual period was 03/25/2001.          Sexually active: No.  The current method of family planning is post menopausal status.    Exercising: Yes.    yoga,pilates Smoker:  no  Health Maintenance: Pap:  05-06-14 neg (previous history of cryo for abnormal pap) MMG:  2/16 & f/u left breast category b density birads 2:neg Colonoscopy:  2011 polyp f/u 66yrs BMD:   2010 normal patient declines ever again( she is active and would not take medication or treatment) TDaP:  2008 Shingles: 2013 Pneumonia: not done Hep C and HIV: not done requests today Labs: poct urine-neg Self breast exam: done occ   reports that she has quit smoking. She has never used smokeless tobacco. She reports that she drinks alcohol. She reports that she does not use illicit drugs.  Past Medical History  Diagnosis Date  . Hemorrhoids   . Inguinal hernia   . Vitamin D deficiency   . DDD (degenerative disc disease)   . Abnormal Pap smear of cervix     over 15 yrs ago    Past Surgical History  Procedure Laterality Date  . Hemorrhoid surgery    . Tonsillectomy    . Breast enhancement surgery      x 2  . Bunionectomy      right foot  . Blepharoplasty  2000  . Cryotherapy      over 15 yrs ago    Current Outpatient Prescriptions  Medication Sig Dispense Refill  . AMBULATORY NON FORMULARY MEDICATION Medication Name: Pyridoxal 1,2 &3 (replacing  Vitamin C, E & B complex & Multivitamin) 1 tablet three times per week.    Marland Kitchen BIOTIN PO Take 10,000 mcg by mouth daily.    . Cholecalciferol (VITAMIN D PO) Take by mouth every other day.    Marland Kitchen PRESCRIPTION MEDICATION Take 1 capsule by mouth daily. Special compound of estriadiol/progesterone. .25-100mg     . UNABLE TO FIND daily. Serene (serotonin support)    . UNABLE TO FIND Carbon 98 BX     No current facility-administered medications for this visit.    Family History  Problem Relation Age of Onset  . Leukemia Father   . Colon cancer Neg Hx   . Hypertension Mother   . COPD Mother     ROS:  Pertinent items are noted in HPI.  Otherwise, a comprehensive ROS was negative.  Exam:   BP 96/60 mmHg  Pulse 68  Resp 16  Ht 5' 4.25" (1.632 m)  Wt 123 lb (55.792 kg)  BMI 20.95 kg/m2  LMP 03/25/2001 Height: 5' 4.25" (163.2 cm) Ht Readings from Last 3 Encounters:  05/26/15 5' 4.25" (1.632 m)  05/06/14 5' 4.25" (1.632 m)  04/19/13 5' 4.25" (1.632 m)    General appearance: alert, cooperative and appears stated age Head: Normocephalic, without obvious abnormality, atraumatic Neck: no adenopathy, supple, symmetrical, trachea midline and thyroid normal to inspection and palpation Lungs: clear to auscultation bilaterally  Breasts: normal appearance, no masses or tenderness, Inspection negative, No nipple retraction or dimpling, No nipple discharge or bleeding Heart: regular rate and rhythm Abdomen: soft, non-tender; no masses,  no organomegaly Extremities: extremities normal, atraumatic, no cyanosis or edema Skin: Skin color, texture, turgor normal. No rashes or lesions Lymph nodes: Cervical, supraclavicular, and axillary nodes normal. No abnormal inguinal nodes palpated on right, left tiny pea size two noted,non tender, no change in size Neurologic: Grossly normal   Pelvic: External genitalia:  no lesions, atrophic appearance              Urethra:  normal appearing urethra with no masses,  tenderness or lesions              Bartholin's and Skene's: normal                 Vagina: normal appearing vagina with normal color and discharge, no lesions              Cervix: normal,non tender,no lesions              Pap taken: Yes.   Bimanual Exam:  Uterus:  normal size, contour, position, consistency, mobility, non-tender              Adnexa: normal adnexa and no mass, fullness, tenderness               Rectovaginal: Confirms               Anus:  normal sphincter tone, no lesions, no blood noted, hemorrhoid noted, not thrombosed or tender  Chaperone present: yes  A:  Well Woman with normal exam  Menopausal no HRT  Known enlarged inguinal lymph nodes on left, very small no change from last exam, patient monitors  Rectal bleeding hemorrhoid noted, but poly history and colonoscopy over due  Screening labs  P:   Reviewed health and wellness pertinent to exam  Aware to advise if vaginal bleeding  Instructed patient to advise if change in size or becomes tender  Discussed good sphincter tone, but bleeding should be evaluated by GI and colonoscopy due. Patient to call Dr. Nichola Sizer office and make appointment for evaluation. Assured me she would do this. Offered to schedule, declined.  Lab: HIV,Hep.C  Pap smear as above  counseled on breast self exam, mammography screening, adequate intake of calcium and vitamin D, diet and exercise  return annually or prn  An After Visit Summary was printed and given to the patient.

## 2015-05-26 NOTE — Patient Instructions (Signed)

## 2015-05-27 LAB — HEPATITIS C ANTIBODY: HCV AB: NEGATIVE

## 2015-05-29 LAB — IPS PAP SMEAR ONLY

## 2015-06-01 NOTE — Progress Notes (Signed)
Encounter reviewed Loetta Connelley, MD   

## 2015-07-25 ENCOUNTER — Ambulatory Visit: Payer: Medicare Other | Admitting: Internal Medicine

## 2015-07-25 ENCOUNTER — Encounter: Payer: Self-pay | Admitting: Internal Medicine

## 2015-07-25 ENCOUNTER — Ambulatory Visit (INDEPENDENT_AMBULATORY_CARE_PROVIDER_SITE_OTHER): Payer: Medicare Other | Admitting: Internal Medicine

## 2015-07-25 VITALS — BP 108/72 | HR 60 | Ht 64.25 in | Wt 123.0 lb

## 2015-07-25 DIAGNOSIS — K648 Other hemorrhoids: Secondary | ICD-10-CM

## 2015-07-25 DIAGNOSIS — R195 Other fecal abnormalities: Secondary | ICD-10-CM

## 2015-07-25 NOTE — Patient Instructions (Signed)
   Cut back on the use of the probiotic as discussed.    I appreciate the opportunity to care for you.

## 2015-07-25 NOTE — Progress Notes (Signed)
   Subjective:    Patient ID: Tammy Porter, female    DOB: 05/11/1950, 64 y.o.   MRN: [id number] Cc: rectal bleeding HPI Very nice elderly woman last seen in our office 09/2011 - epigastric pain. Has had some intermittent rectal bleeding that worsened after switching from metamucil to a homemade probiotic mixture with yogurt and bacteria. Stools are "all over the place from water to peanut butter". Has had red blood on paper and into bowl but always has stool that is not bloody with it. Also has occasionally had some stool seepage when urinating. No abdominal pain. Also sees Dr. Marina Porter - was told to see GI by GYN CNM.  Had hemorrhoidectomy by Dr. Rise Porter  2010 and had problems with lack of sensation afterwards - resolved over time.  Lab Results  Component Value Date   HGB 14.1 05/06/2014   Medications, allergies, past medical history, past surgical history, family history and social history are reviewed and updated in the EMR.  Review of Systems As above,     Objective:   Physical Exam BP 108/72 mmHg  Pulse 60  Ht 5' 4.25" (1.632 m)  Wt 123 lb (55.792 kg)  BMI 20.95 kg/m2  LMP 03/25/2001 WDWN NAD Eyes anicteric Appropriate mood and affect  Tammy Porter CMA present  Rectal: small fleshy tags, + stenosis, not tender, no mass Anoscopy: 0.125% NTG ointment applied  Gr 2 internal hemorrhoids all positions, mildly inflamed      Assessment & Plan:   Encounter Diagnoses  Name Primary?  . Bleeding internal hemorrhoids Yes  . Loose stools     Seems mostly self-limited and aggravated after increasing stool frequency that is temporally associated with changing from metamucil to yogurt/probiotic She will reduce consumption of yogurt/probiotic mixture to see if stools firm up Last Hgb NL in Feb Colonoscopy 2011 - diverticulosis next routine 2021  If worsens or becomes anemic should come back and she is aware Hemorrhoid banding an option but would try above first  I appreciate  the opportunity to care for this patient. Cc: Tammy Porter CNM

## 2016-05-31 ENCOUNTER — Ambulatory Visit: Payer: Medicare Other | Admitting: Certified Nurse Midwife

## 2016-06-05 NOTE — Progress Notes (Signed)
66 y.o. G2P1011 Single  Caucasian Fe here for annual exam. Menopausal no HRT. Denies vaginal bleeding or vaginal dryness issues. Sees Dr.Wanick for labs and exam, all normal.  Saw Dr. Carlean Purl for hemorrhoids last year, no other issues with it today. No change in inguinal lymph nodes that she can tell. 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   Patient's last menstrual period was 03/25/2001.          Sexually active: No.  The current method of family planning is post menopausal status.    Exercising: Yes.    yoga, pilates Smoker:  Former smoker   Health Maintenance: Pap:  05/26/15 negative, 05/06/14 negative, 04/19/13 negative, HR HPV negative  MMG:  06/16/15 Breast category B density BIRADS 2 benign  Colonoscopy:  12/29/09 normal- repeat 10 years  BMD:   2010  Normal, declines would not treat, does yoga, TDaP:  07/24/06  Shingles: 2013  Pneumonia: had 1st part done Hep C and HIV: 05/26/15 both negative  Labs: pcp Self breast exam: done monthly   reports that she has quit smoking. She has never used smokeless tobacco. She reports that she drinks alcohol. She reports that she does not use drugs.  Past Medical History:  Diagnosis Date  . Abnormal Pap smear of cervix    over 15 yrs ago  . DDD (degenerative disc disease)   . Diverticulosis   . Hemorrhoids   . Inguinal hernia   . Vitamin D deficiency     Past Surgical History:  Procedure Laterality Date  . BLEPHAROPLASTY  2000  . BREAST ENHANCEMENT SURGERY     x 2  . BUNIONECTOMY     right foot  . COLONOSCOPY    . CRYOTHERAPY     over 15 yrs ago  . HEMORRHOID SURGERY    . INGUINAL HERNIA REPAIR Left   . TONSILLECTOMY      Current Outpatient Prescriptions  Medication Sig Dispense Refill  . AMBULATORY NON FORMULARY MEDICATION Medication Name: Pyridoxal 1,2 &3 (replacing Vitamin C, E & B complex & Multivitamin) 1 tablet three times per week.    Marland Kitchen BIOTIN PO Take 10,000 mcg by mouth daily.    . Cholecalciferol (VITAMIN D PO) Take by mouth  every other day.    Marland Kitchen PRESCRIPTION MEDICATION Take 1 capsule by mouth daily. Special compound of estriadiol/progesterone. .25-100mg     . UNABLE TO FIND daily. Serene (serotonin support)    . UNABLE TO FIND Carbon 98 BX     No current facility-administered medications for this visit.     Family History  Problem Relation Age of Onset  . Leukemia Father   . Colon cancer Neg Hx   . Hypertension Mother   . COPD Mother     ROS:  Pertinent items are noted in HPI.  Otherwise, a comprehensive ROS was negative.  Exam:   LMP 03/25/2001    Ht Readings from Last 3 Encounters:  07/25/15 5' 4.25" (1.632 m)  05/26/15 5' 4.25" (1.632 m)  05/06/14 5' 4.25" (1.632 m)    General appearance: alert, cooperative and appears stated age Head: Normocephalic, without obvious abnormality, atraumatic Neck: no adenopathy, supple, symmetrical, trachea midline and thyroid normal to inspection and palpation Lungs: clear to auscultation bilaterally Breasts: normal appearance, no masses or tenderness, No nipple retraction or dimpling, No nipple discharge or bleeding, Axillary lymph nodes bilateral enlarged, pea size but one distinct bilateral at anterior axillary creast, also breast mass on RUQ at 10,11,12 o'clock mobile 3 fb from  aerola.   ? silcone vs breast mass.  Bilateral silcone implants greater than 25 years Heart: regular rate and rhythm Abdomen: soft, non-tender; no masses,  no organomegaly Extremities: extremities normal, atraumatic, no cyanosis or edema Skin: Skin color, texture, turgor normal. No rashes or lesions Lymph nodes: Cervical, supraclavicular nodes normal. Small non tender lymph nodes in inquinal area, no change from previous exam  Neurologic: Grossly normal   Pelvic: External genitalia:  no lesions              Urethra:  normal appearing urethra with no masses, tenderness or lesions              Bartholin's and Skene's: normal                 Vagina: normal appearing vagina with normal  color and discharge, no lesions              Cervix:               Pap taken: No. Bimanual Exam:  Uterus:  normal size, contour, position, consistency, mobility, non-tender              Adnexa: normal adnexa and no mass, fullness, tenderness               Rectovaginal: Confirms               Anus:  normal sphincter tone, no lesions  Chaperone present: yes  A:  Well Woman with normal exam  Menopausal   Enlarged axillary lymph nodes bilateral(new)   Right breast masses ? Silicone from current implants  Palpable inguinal lymph nodes bilateral, non tender, no change (known)  Immunization due  P:   Reviewed health and wellness pertinent to exam  Aware of need to evaluate if vaginal bleeding  Discussed axillary/breast and inguinal findings and need to evaluate with Lab and US/diagnostic mammogram of breast and axillary area. Areas felt by Dr. Sabra Heck and agree with finding and plan. Patient agreeable and will be scheduled prior to leaving today. Questions addressed.  Lab: CBC with diff  Requests TDAP  Pap smear as above not taken  Continue follow up with Dr. Marina Gravel as indicated   counseled on breast self exam, mammography screening, adequate intake of calcium and vitamin D, diet and exercise, Kegel's exercises  return annually or prn  An After Visit Summary was printed and given to the patient.

## 2016-06-06 ENCOUNTER — Encounter: Payer: Self-pay | Admitting: Certified Nurse Midwife

## 2016-06-06 ENCOUNTER — Ambulatory Visit (INDEPENDENT_AMBULATORY_CARE_PROVIDER_SITE_OTHER): Payer: Medicare Other | Admitting: Certified Nurse Midwife

## 2016-06-06 VITALS — BP 98/60 | HR 68 | Resp 16 | Ht 63.75 in | Wt 124.0 lb

## 2016-06-06 DIAGNOSIS — N6311 Unspecified lump in the right breast, upper outer quadrant: Secondary | ICD-10-CM

## 2016-06-06 DIAGNOSIS — R599 Enlarged lymph nodes, unspecified: Secondary | ICD-10-CM | POA: Diagnosis not present

## 2016-06-06 DIAGNOSIS — Z23 Encounter for immunization: Secondary | ICD-10-CM | POA: Diagnosis not present

## 2016-06-06 DIAGNOSIS — Z124 Encounter for screening for malignant neoplasm of cervix: Secondary | ICD-10-CM | POA: Diagnosis not present

## 2016-06-06 DIAGNOSIS — Z01419 Encounter for gynecological examination (general) (routine) without abnormal findings: Secondary | ICD-10-CM | POA: Diagnosis not present

## 2016-06-06 LAB — CBC WITH DIFFERENTIAL/PLATELET
BASOS PCT: 0 %
Basophils Absolute: 0 cells/uL (ref 0–200)
EOS PCT: 2 %
Eosinophils Absolute: 120 cells/uL (ref 15–500)
HCT: 44.7 % (ref 35.0–45.0)
Hemoglobin: 15.1 g/dL (ref 11.7–15.5)
Lymphocytes Relative: 50 %
Lymphs Abs: 3000 cells/uL (ref 850–3900)
MCH: 32.3 pg (ref 27.0–33.0)
MCHC: 33.8 g/dL (ref 32.0–36.0)
MCV: 95.5 fL (ref 80.0–100.0)
MONOS PCT: 7 %
MPV: 9.3 fL (ref 7.5–12.5)
Monocytes Absolute: 420 cells/uL (ref 200–950)
NEUTROS ABS: 2460 {cells}/uL (ref 1500–7800)
Neutrophils Relative %: 41 %
PLATELETS: 237 10*3/uL (ref 140–400)
RBC: 4.68 MIL/uL (ref 3.80–5.10)
RDW: 13.6 % (ref 11.0–15.0)
WBC: 6 10*3/uL (ref 3.8–10.8)

## 2016-06-06 NOTE — Progress Notes (Signed)
Spoke with [place] at [place]. Patient scheduled while in office for bilateral diagnostic MMG and ultrasound for 06/13/16 at 1pm. Patient is agreeable to date and time. Order faxed to [facility] at [phone].

## 2016-06-06 NOTE — Progress Notes (Signed)
Reviewed personally.  M. Suzanne Lashaunda Schild, MD.  

## 2016-06-06 NOTE — Patient Instructions (Signed)

## 2016-06-20 ENCOUNTER — Telehealth: Payer: Self-pay | Admitting: Certified Nurse Midwife

## 2016-06-20 DIAGNOSIS — R599 Enlarged lymph nodes, unspecified: Secondary | ICD-10-CM

## 2016-06-20 NOTE — Telephone Encounter (Signed)
Return call to patient regarding mammogram results. Discussed lymph nodes not specifically evaluated and feel due to size and location should seek surgical consult. Discussed with Dr. Sabra Heck who concures with recommendation. Patient will be called with information regarding appointment and referral. Questions addressed.

## 2016-06-20 NOTE — Telephone Encounter (Signed)
Phone call to patient regarding results and concerns of lymph node enlargement. Extravasated silicone noted on report bilateral UOQ of breast. No abnormalities noted in axillary area except for collection in left axillary tail which is benign. Dr. Marcelo Baldy did not indicate to patient where the silicone was in the UOQ of our concern. Recommended repeat mammogram one year. Will call to advise patient if further evaluation is necessary.

## 2016-07-03 ENCOUNTER — Telehealth: Payer: Self-pay | Admitting: Certified Nurse Midwife

## 2016-07-03 NOTE — Telephone Encounter (Signed)
Call to patient to review appointment information for referral to [facility]. Patient states she is aware and had to cancel the appointment. She states she will call to reschedule at a later date due to her personal schedule being very full currently.  Patient aware to call if any needs arise. Routing to provider for review prior to closing.

## 2016-07-03 NOTE — Telephone Encounter (Signed)
I feel this patient should schedule this due finding and Dr. Sabra Heck also agrees. Can you check with her in 2 weeks

## 2016-07-18 ENCOUNTER — Encounter: Payer: Self-pay | Admitting: Certified Nurse Midwife

## 2016-07-19 NOTE — Telephone Encounter (Signed)
Left message to call Tayloranne Lekas at [phone]. 

## 2016-07-22 NOTE — Telephone Encounter (Signed)
Spoke with patient. Patient declines to reschedule appointment with [facility]. Advised of importance of scheduling and recommendations from [place] and [place]. Patient declines assistance to reschedule this appointment at this time.

## 2016-07-24 NOTE — Telephone Encounter (Signed)
She is aware of our recommendation and importance discussed in person and on phone. Feel this appropriate. Follow up recommendation. Can we put her mammogram recall. CC to Dr. Sabra Heck

## 2016-07-25 NOTE — Telephone Encounter (Signed)
Pt is clearly aware of recommendations and has declined not to schedule.  Do not feel recall is needed.  OK to close encounter.

## 2017-02-21 ENCOUNTER — Other Ambulatory Visit: Payer: Self-pay | Admitting: Ophthalmology

## 2017-04-03 DIAGNOSIS — R69 Illness, unspecified: Secondary | ICD-10-CM | POA: Diagnosis not present

## 2017-04-15 DIAGNOSIS — L570 Actinic keratosis: Secondary | ICD-10-CM | POA: Diagnosis not present

## 2017-04-15 DIAGNOSIS — Z85828 Personal history of other malignant neoplasm of skin: Secondary | ICD-10-CM | POA: Diagnosis not present

## 2017-04-15 DIAGNOSIS — D225 Melanocytic nevi of trunk: Secondary | ICD-10-CM | POA: Diagnosis not present

## 2017-04-15 DIAGNOSIS — D1801 Hemangioma of skin and subcutaneous tissue: Secondary | ICD-10-CM | POA: Diagnosis not present

## 2017-04-15 DIAGNOSIS — L821 Other seborrheic keratosis: Secondary | ICD-10-CM | POA: Diagnosis not present

## 2017-04-15 DIAGNOSIS — D485 Neoplasm of uncertain behavior of skin: Secondary | ICD-10-CM | POA: Diagnosis not present

## 2017-04-15 DIAGNOSIS — Z23 Encounter for immunization: Secondary | ICD-10-CM | POA: Diagnosis not present

## 2017-04-15 DIAGNOSIS — L814 Other melanin hyperpigmentation: Secondary | ICD-10-CM | POA: Diagnosis not present

## 2017-06-03 DIAGNOSIS — Z85828 Personal history of other malignant neoplasm of skin: Secondary | ICD-10-CM | POA: Diagnosis not present

## 2017-06-03 DIAGNOSIS — Z79890 Hormone replacement therapy (postmenopausal): Secondary | ICD-10-CM | POA: Diagnosis not present

## 2017-06-03 DIAGNOSIS — R32 Unspecified urinary incontinence: Secondary | ICD-10-CM | POA: Diagnosis not present

## 2017-06-03 DIAGNOSIS — Z809 Family history of malignant neoplasm, unspecified: Secondary | ICD-10-CM | POA: Diagnosis not present

## 2017-06-03 DIAGNOSIS — Z87891 Personal history of nicotine dependence: Secondary | ICD-10-CM | POA: Diagnosis not present

## 2017-06-03 DIAGNOSIS — Z825 Family history of asthma and other chronic lower respiratory diseases: Secondary | ICD-10-CM | POA: Diagnosis not present

## 2017-06-12 ENCOUNTER — Ambulatory Visit: Payer: Medicare Other | Admitting: Certified Nurse Midwife

## 2017-06-19 ENCOUNTER — Ambulatory Visit (INDEPENDENT_AMBULATORY_CARE_PROVIDER_SITE_OTHER): Payer: Medicare HMO | Admitting: Certified Nurse Midwife

## 2017-06-19 ENCOUNTER — Other Ambulatory Visit: Payer: Self-pay

## 2017-06-19 ENCOUNTER — Encounter: Payer: Self-pay | Admitting: Certified Nurse Midwife

## 2017-06-19 ENCOUNTER — Other Ambulatory Visit (HOSPITAL_COMMUNITY)
Admission: RE | Admit: 2017-06-19 | Discharge: 2017-06-19 | Disposition: A | Discharge status: Home / Self Care | Payer: Medicare HMO | Source: Ambulatory Visit | Attending: Certified Nurse Midwife | Admitting: Certified Nurse Midwife

## 2017-06-19 VITALS — BP 110/70 | HR 68 | Resp 16 | Ht 64.25 in | Wt 124.0 lb

## 2017-06-19 DIAGNOSIS — Z01411 Encounter for gynecological examination (general) (routine) with abnormal findings: Secondary | ICD-10-CM

## 2017-06-19 DIAGNOSIS — N951 Menopausal and female climacteric states: Secondary | ICD-10-CM | POA: Diagnosis not present

## 2017-06-19 DIAGNOSIS — N6310 Unspecified lump in the right breast, unspecified quadrant: Secondary | ICD-10-CM | POA: Diagnosis not present

## 2017-06-19 DIAGNOSIS — Z124 Encounter for screening for malignant neoplasm of cervix: Secondary | ICD-10-CM | POA: Insufficient documentation

## 2017-06-19 DIAGNOSIS — Z79890 Hormone replacement therapy (postmenopausal): Secondary | ICD-10-CM | POA: Diagnosis not present

## 2017-06-19 NOTE — Progress Notes (Signed)
67 y.o. G2P1011 Single  Caucasian Fe here for annual exam. Menopausal no HRT. Denies vaginal bleeding or vaginal dryness. Has trying to decide on when she can have cataract surgery. Daughter getting married this next weekend and will be due with her first child on Mother's Day. Sexually activity no concerns. Does SBE most of the times, has not noted change. Sees Dr.Wanick for aex/labs and HRT management. Considering stopping HRT, doesn't feel like she wants to continue due to discussion regarding risks/benefits. Still has small palpable lymph node in left groin area with no change in years. Keeps check on. Will discuss with Dr. Marina Gravel. No other health issues today.    Patient's last menstrual period was 03/25/2001.          Not sexually active   The current method of family planning is post menopausal status.    Exercising: Yes.    yoga, strength Smoker:  no  Health Maintenance: Pap:  05-26-15 neg History of Abnormal Pap: yes MMG:  06-13-16 bilateral & u/s neg Self Breast exams: occ Colonoscopy:  2011 f/u 15yrs BMD:   2010 TDaP:  2018 Shingles: 2013 Pneumonia: had done Hep C and HIV: both neg 2017 Labs: yes   reports that she has quit smoking. She has never used smokeless tobacco. She reports that she drinks about 4.2 oz of alcohol per week. She reports that she does not use drugs.  Past Medical History:  Diagnosis Date  . Abnormal Pap smear of cervix    over 15 yrs ago  . DDD (degenerative disc disease)   . Diverticulosis   . Hemorrhoids   . Inguinal hernia   . Vitamin D deficiency     Past Surgical History:  Procedure Laterality Date  . BLEPHAROPLASTY  2000  . BREAST ENHANCEMENT SURGERY     x 2  . BUNIONECTOMY     right foot  . COLONOSCOPY    . CRYOTHERAPY     over 15 yrs ago  . HEMORRHOID SURGERY    . INGUINAL HERNIA REPAIR Left   . TONSILLECTOMY      Current Outpatient Medications  Medication Sig Dispense Refill  . AMBULATORY NON FORMULARY MEDICATION Medication  Name: Pyridoxal 1,2 &3 (replacing Vitamin C, E & B complex & Multivitamin) 1 tablet three times per week.    Marland Kitchen BIOTIN PO Take 10,000 mcg by mouth daily.    . Cholecalciferol (VITAMIN D PO) Take by mouth every other day.    Marland Kitchen PRESCRIPTION MEDICATION Take 1 capsule by mouth daily. Special compound of estriadiol/progesterone. .25-100mg     . UNABLE TO FIND daily. Serene (serotonin support)    . UNABLE TO FIND Carbon 98 BX     No current facility-administered medications for this visit.     Family History  Problem Relation Age of Onset  . Leukemia Father   . Colon cancer Neg Hx   . Hypertension Mother   . COPD Mother     ROS:  Pertinent items are noted in HPI.  Otherwise, a comprehensive ROS was negative.  Exam:   BP 110/70   Pulse 68   Resp 16   Ht 5' 4.25" (1.632 m)   Wt 124 lb (56.2 kg)   LMP 03/25/2001   BMI 21.12 kg/m  Height: 5' 4.25" (163.2 cm) Ht Readings from Last 3 Encounters:  06/19/17 5' 4.25" (1.632 m)  06/06/16 5' 3.75" (1.619 m)  07/25/15 5' 4.25" (1.632 m)    General appearance: alert, cooperative and appears stated age  Head: Normocephalic, without obvious abnormality, atraumatic Neck: no adenopathy, supple, symmetrical, trachea midline and thyroid normal to inspection and palpation Lungs: clear to auscultation bilaterally Breasts: normal appearance, no masses or tenderness, No nipple retraction or dimpling, No nipple discharge or bleeding, No axillary or supraclavicular adenopathy Right breast masses noted at 11-12 o'clock 1 fb from aerola mobile cluster on top of each other. Has older implants. ? Implant issue Heart: regular rate and rhythm Abdomen: soft, non-tender; no masses,  no organomegaly Extremities: extremities normal, atraumatic, no cyanosis or edema Skin: Skin color, texture, turgor normal. No rashes or lesions Lymph nodes: Cervical, supraclavicular, and axillary nodes normal. No abnormal inguinal nodes palpated, small inguinal lymph node in left  groin no change Neurologic: Grossly normal   Pelvic: External genitalia:  no lesions, atrophic appearance              Urethra:  normal appearing urethra with no masses, tenderness or lesions              Bartholin's and Skene's: normal                 Vagina: normal appearing vagina with normal color and discharge, no lesions              Cervix: multiparous appearance, no cervical motion tenderness and no lesions              Pap taken: Yes.   Bimanual Exam:  Uterus:  normal size, contour, position, consistency, mobility, non-tender and anteverted              Adnexa: normal adnexa and no mass, fullness, tenderness               Rectovaginal: Confirms               Anus:  normal sphincter tone, no lesions  Chaperone present: yes  A:  Well Woman with normal exam  Menopausal on HRT with Dr. Marina Gravel  Right breast masses vs implant issue  Vaginal dryness with OTC product use  History of enlarged inguinal lymph node in left groin, no change in size  P:   Reviewed health and wellness pertinent to exam  Aware of need to advise if vaginal bleeding.  Discussed risks/benefits of HRT, questions addressed. Patient will discuss with Dr. Marina Gravel and will decide.  Discussed finding and need to have evaluation with diagnostic mammogram and [name]. Patient agreeable. Will be scheduled prior to leaving today.  Continue to monitor and advise if change  Pap smear: yes  counseled on breast self exam, mammography screening, feminine hygiene, adequate intake of calcium and vitamin D, diet and exercise  return annually or prn  An After Visit Summary was printed and given to the patient.

## 2017-06-19 NOTE — Progress Notes (Signed)
Patient scheduled while in office for bilateral dx MMG with [name]. Patient has implants, is due for screening MMG. Spoke with Hassan Rowan at [place], scheduled for 4/4 at 10am. Patient verbalizes understanding and is agreeable.   Written order faxed to [facility].

## 2017-06-20 LAB — CYTOLOGY - PAP
DIAGNOSIS: NEGATIVE
HPV: NOT DETECTED

## 2017-06-26 DIAGNOSIS — N6311 Unspecified lump in the right breast, upper outer quadrant: Secondary | ICD-10-CM | POA: Diagnosis not present

## 2017-06-26 DIAGNOSIS — R928 Other abnormal and inconclusive findings on diagnostic imaging of breast: Secondary | ICD-10-CM | POA: Diagnosis not present

## 2017-07-04 DIAGNOSIS — N951 Menopausal and female climacteric states: Secondary | ICD-10-CM | POA: Diagnosis not present

## 2017-07-06 DIAGNOSIS — N951 Menopausal and female climacteric states: Secondary | ICD-10-CM | POA: Diagnosis not present

## 2017-07-08 DIAGNOSIS — N951 Menopausal and female climacteric states: Secondary | ICD-10-CM | POA: Diagnosis not present

## 2017-07-08 DIAGNOSIS — R5383 Other fatigue: Secondary | ICD-10-CM | POA: Diagnosis not present

## 2017-07-08 DIAGNOSIS — R7989 Other specified abnormal findings of blood chemistry: Secondary | ICD-10-CM | POA: Diagnosis not present

## 2017-07-08 DIAGNOSIS — E559 Vitamin D deficiency, unspecified: Secondary | ICD-10-CM | POA: Diagnosis not present

## 2017-07-15 DIAGNOSIS — N951 Menopausal and female climacteric states: Secondary | ICD-10-CM | POA: Diagnosis not present

## 2017-07-17 DIAGNOSIS — M9905 Segmental and somatic dysfunction of pelvic region: Secondary | ICD-10-CM | POA: Diagnosis not present

## 2017-07-17 DIAGNOSIS — M47814 Spondylosis without myelopathy or radiculopathy, thoracic region: Secondary | ICD-10-CM | POA: Diagnosis not present

## 2017-07-17 DIAGNOSIS — M9902 Segmental and somatic dysfunction of thoracic region: Secondary | ICD-10-CM | POA: Diagnosis not present

## 2017-07-17 DIAGNOSIS — M9903 Segmental and somatic dysfunction of lumbar region: Secondary | ICD-10-CM | POA: Diagnosis not present

## 2017-07-22 DIAGNOSIS — Z23 Encounter for immunization: Secondary | ICD-10-CM | POA: Diagnosis not present

## 2017-07-25 DIAGNOSIS — H43813 Vitreous degeneration, bilateral: Secondary | ICD-10-CM | POA: Diagnosis not present

## 2017-07-25 DIAGNOSIS — H25813 Combined forms of age-related cataract, bilateral: Secondary | ICD-10-CM | POA: Diagnosis not present

## 2017-07-25 DIAGNOSIS — H524 Presbyopia: Secondary | ICD-10-CM | POA: Diagnosis not present

## 2017-07-29 DIAGNOSIS — L820 Inflamed seborrheic keratosis: Secondary | ICD-10-CM | POA: Diagnosis not present

## 2017-07-29 DIAGNOSIS — D485 Neoplasm of uncertain behavior of skin: Secondary | ICD-10-CM | POA: Diagnosis not present

## 2017-07-29 DIAGNOSIS — L814 Other melanin hyperpigmentation: Secondary | ICD-10-CM | POA: Diagnosis not present

## 2017-08-14 ENCOUNTER — Telehealth: Payer: Self-pay | Admitting: Certified Nurse Midwife

## 2017-08-14 NOTE — Telephone Encounter (Signed)
Patient returning call to Debbi.  °

## 2017-08-15 NOTE — Telephone Encounter (Signed)
Spoke with patient regarding mammogram finding of extracapsular silicone in the breast from silicone implant leakage and extension in to axilla. Patient was aware of this, and discussed with radiologist at time of mammogram. Patient unsure if she will have removed or replaced. Discussed possible concern with extension into axilla and unknown response. Recommended to patient to have consult with plastic surgeon and discuss options and possible experience with and recommendations.  Patient will consider and will advise if needs assistance from our office. Appreciative of call.

## 2017-09-03 DIAGNOSIS — Z0000 Encounter for general adult medical examination without abnormal findings: Secondary | ICD-10-CM | POA: Diagnosis not present

## 2017-09-29 ENCOUNTER — Encounter: Payer: Self-pay | Admitting: Certified Nurse Midwife

## 2017-10-21 DIAGNOSIS — H268 Other specified cataract: Secondary | ICD-10-CM | POA: Diagnosis not present

## 2017-10-21 DIAGNOSIS — H25811 Combined forms of age-related cataract, right eye: Secondary | ICD-10-CM | POA: Diagnosis not present

## 2017-11-04 DIAGNOSIS — H25812 Combined forms of age-related cataract, left eye: Secondary | ICD-10-CM | POA: Diagnosis not present

## 2017-11-04 DIAGNOSIS — H268 Other specified cataract: Secondary | ICD-10-CM | POA: Diagnosis not present

## 2017-11-10 DIAGNOSIS — R69 Illness, unspecified: Secondary | ICD-10-CM | POA: Diagnosis not present

## 2017-11-19 DIAGNOSIS — Z961 Presence of intraocular lens: Secondary | ICD-10-CM | POA: Diagnosis not present

## 2017-12-18 DIAGNOSIS — M9902 Segmental and somatic dysfunction of thoracic region: Secondary | ICD-10-CM | POA: Diagnosis not present

## 2017-12-18 DIAGNOSIS — M47814 Spondylosis without myelopathy or radiculopathy, thoracic region: Secondary | ICD-10-CM | POA: Diagnosis not present

## 2017-12-18 DIAGNOSIS — M9903 Segmental and somatic dysfunction of lumbar region: Secondary | ICD-10-CM | POA: Diagnosis not present

## 2017-12-18 DIAGNOSIS — M9905 Segmental and somatic dysfunction of pelvic region: Secondary | ICD-10-CM | POA: Diagnosis not present

## 2017-12-23 DIAGNOSIS — M5126 Other intervertebral disc displacement, lumbar region: Secondary | ICD-10-CM | POA: Insufficient documentation

## 2017-12-25 DIAGNOSIS — M47814 Spondylosis without myelopathy or radiculopathy, thoracic region: Secondary | ICD-10-CM | POA: Diagnosis not present

## 2017-12-25 DIAGNOSIS — Z23 Encounter for immunization: Secondary | ICD-10-CM | POA: Diagnosis not present

## 2017-12-25 DIAGNOSIS — M9903 Segmental and somatic dysfunction of lumbar region: Secondary | ICD-10-CM | POA: Diagnosis not present

## 2017-12-25 DIAGNOSIS — M9902 Segmental and somatic dysfunction of thoracic region: Secondary | ICD-10-CM | POA: Diagnosis not present

## 2017-12-25 DIAGNOSIS — M9905 Segmental and somatic dysfunction of pelvic region: Secondary | ICD-10-CM | POA: Diagnosis not present

## 2018-01-01 DIAGNOSIS — M9905 Segmental and somatic dysfunction of pelvic region: Secondary | ICD-10-CM | POA: Diagnosis not present

## 2018-01-01 DIAGNOSIS — M47814 Spondylosis without myelopathy or radiculopathy, thoracic region: Secondary | ICD-10-CM | POA: Diagnosis not present

## 2018-01-01 DIAGNOSIS — M9902 Segmental and somatic dysfunction of thoracic region: Secondary | ICD-10-CM | POA: Diagnosis not present

## 2018-01-01 DIAGNOSIS — M9903 Segmental and somatic dysfunction of lumbar region: Secondary | ICD-10-CM | POA: Diagnosis not present

## 2018-01-06 DIAGNOSIS — E559 Vitamin D deficiency, unspecified: Secondary | ICD-10-CM | POA: Diagnosis not present

## 2018-01-06 DIAGNOSIS — R5383 Other fatigue: Secondary | ICD-10-CM | POA: Diagnosis not present

## 2018-01-06 DIAGNOSIS — N951 Menopausal and female climacteric states: Secondary | ICD-10-CM | POA: Diagnosis not present

## 2018-01-06 DIAGNOSIS — R7989 Other specified abnormal findings of blood chemistry: Secondary | ICD-10-CM | POA: Diagnosis not present

## 2018-01-14 DIAGNOSIS — N951 Menopausal and female climacteric states: Secondary | ICD-10-CM | POA: Diagnosis not present

## 2018-01-22 DIAGNOSIS — M47814 Spondylosis without myelopathy or radiculopathy, thoracic region: Secondary | ICD-10-CM | POA: Diagnosis not present

## 2018-01-22 DIAGNOSIS — M9903 Segmental and somatic dysfunction of lumbar region: Secondary | ICD-10-CM | POA: Diagnosis not present

## 2018-01-22 DIAGNOSIS — M9905 Segmental and somatic dysfunction of pelvic region: Secondary | ICD-10-CM | POA: Diagnosis not present

## 2018-01-22 DIAGNOSIS — M9902 Segmental and somatic dysfunction of thoracic region: Secondary | ICD-10-CM | POA: Diagnosis not present

## 2018-02-25 DIAGNOSIS — M25559 Pain in unspecified hip: Secondary | ICD-10-CM | POA: Diagnosis not present

## 2018-02-25 DIAGNOSIS — M545 Low back pain: Secondary | ICD-10-CM | POA: Diagnosis not present

## 2018-02-25 DIAGNOSIS — M4800 Spinal stenosis, site unspecified: Secondary | ICD-10-CM | POA: Diagnosis not present

## 2018-03-11 ENCOUNTER — Ambulatory Visit (INDEPENDENT_AMBULATORY_CARE_PROVIDER_SITE_OTHER): Payer: Medicare HMO | Admitting: Family Medicine

## 2018-03-11 ENCOUNTER — Encounter (INDEPENDENT_AMBULATORY_CARE_PROVIDER_SITE_OTHER): Payer: Self-pay | Admitting: Family Medicine

## 2018-03-11 DIAGNOSIS — M545 Low back pain, unspecified: Secondary | ICD-10-CM

## 2018-03-11 MED ORDER — ETODOLAC 400 MG PO TABS: 400.0000 mg | ORAL_TABLET | Freq: Two times a day (BID) | ORAL | 3 refills | Status: DC | PRN

## 2018-03-11 MED ORDER — PREDNISONE 10 MG PO TABS: ORAL_TABLET | ORAL | 0 refills | Status: DC

## 2018-03-11 NOTE — Progress Notes (Signed)
Office Visit Note   Patient: Tammy Porter           Date of Birth: May 28, 1950           MRN: [id number] Visit Date: 03/11/2018 Requested by: No referring provider defined for this encounter. PCP: System, Pcp Not In  Subjective: Chief Complaint  Patient presents with  . Lower Back - Pain    HPI: She is a 68 year old with low back and right posterior hip pain.  Longstanding intermittent problems with her back.  She had an MRI in 2011 showing grade 1 L5-S1 spondylolisthesis with foraminal stenosis as well as a left-sided small protrusion at L4-5.  She has had intermittent flareups of pain since then, but in the last several months her pain has become more pronounced.  She has been to a chiropractor with temporary improvement.  She has had massage therapy.  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  Recently she has started requiring Aleve for pain and she now presents for further recommendations.  She started noticing that hyperextension activities were exacerbating her pain so she recently stopped doing that.  She had x-rays done recently which were reviewed on computer today showing degenerative disc disease L3-4 and L5-S1, with still a grade 1 anterolisthesis L5 on S1.  It might be a little bit more progressed than before.  Denies fevers, chills, night sweats, unintentional weight change.                ROS: She is otherwise in excellent health, all other systems were reviewed and are negative.  Objective: Vital Signs: LMP 03/25/2001   Physical Exam:  Low back: Excellent flexibility, no scoliosis.  Stork test is negative.  Mild tenderness in the midline L4-5, mild tenderness near the superior aspect of the right SI joint.  Multiple tender trigger points in the gluteus medius region and this seems to reproduce her pain.  She has tenderness along the piriformis muscle to the posterior aspect of the greater trochanter.  No pain with internal hip rotation, lower  extremity strength and reflexes are normal although knee and ankle DTRs are hypoactive.  Imaging: None today.  Assessment & Plan: 1.  Chronic low back pain, recently worse, with spondylolisthesis L5-S1 and foraminal stenosis.  Question right-sided SI joint dysfunction or possibly piriformis syndrome. -She will try prednisone taper followed by Lodine for inflammation.  If symptoms persist then dry needling at [facility] physical therapy. -Lumbar MRI scan if fails conservative management.   Follow-Up Instructions: No follow-ups on file.      Procedures: No procedures performed  No notes on file    PMFS History: Patient Active Problem List   Diagnosis Date Noted  . Bleeding internal hemorrhoids 07/25/2015  . Loose stools 07/25/2015  . EXTERNAL HEMORRHOIDS 06/14/2008   Past Medical History:  Diagnosis Date  . Abnormal Pap smear of cervix    over 15 yrs ago  . DDD (degenerative disc disease)   . Diverticulosis   . Hemorrhoids   . Inguinal hernia   . Vitamin D deficiency     Family History  Problem Relation Age of Onset  . Leukemia Father   . Hypertension Mother   . COPD Mother   . Colon cancer Neg Hx     Past Surgical History:  Procedure Laterality Date  . BLEPHAROPLASTY  2000  . BREAST ENHANCEMENT SURGERY     x 2  . BUNIONECTOMY     right foot  .  COLONOSCOPY    . CRYOTHERAPY     over 15 yrs ago  . HEMORRHOID SURGERY    . INGUINAL HERNIA REPAIR Left   . TONSILLECTOMY     Social History   Occupational History  . Occupation: Radiographer, therapeutic: OTHER  Tobacco Use  . Smoking status: Former Research scientist (life sciences)  . Smokeless tobacco: Never Used  Substance and Sexual Activity  . Alcohol use: Yes    Alcohol/week: 7.0 standard drinks    Types: 7 Standard drinks or equivalent per week  . Drug use: No  . Sexual activity: Not Currently    Partners: Male    Birth control/protection: Post-menopausal

## 2018-04-01 ENCOUNTER — Encounter (INDEPENDENT_AMBULATORY_CARE_PROVIDER_SITE_OTHER): Payer: Self-pay | Admitting: Family Medicine

## 2018-04-01 ENCOUNTER — Ambulatory Visit (INDEPENDENT_AMBULATORY_CARE_PROVIDER_SITE_OTHER): Payer: Medicare HMO | Admitting: Family Medicine

## 2018-04-01 ENCOUNTER — Telehealth (INDEPENDENT_AMBULATORY_CARE_PROVIDER_SITE_OTHER): Payer: Self-pay | Admitting: Family Medicine

## 2018-04-01 DIAGNOSIS — M545 Low back pain, unspecified: Secondary | ICD-10-CM

## 2018-04-01 MED ORDER — HYDROCODONE-ACETAMINOPHEN 5-325 MG PO TABS: 1.0000 | ORAL_TABLET | Freq: Four times a day (QID) | ORAL | 0 refills | Status: DC | PRN

## 2018-04-01 NOTE — Telephone Encounter (Signed)
Dr. Junius Roads will see her for evaluation now.

## 2018-04-01 NOTE — Progress Notes (Signed)
Office Visit Note   Patient: Tanisia Porter           Date of Birth: 1950-06-01           MRN: [id number] Visit Date: 04/01/2018 Requested by: No referring provider defined for this encounter. PCP: System, Pcp Not In  Subjective: Chief Complaint  Patient presents with  . Right Hip - Pain    Pain started this morning in the right buttock and lateral hip area. No longer having pain in the lower back post Prednisone taper.    HPI: She is here with severe right posterior hip pain.  She recently finished prednisone taper and felt better while on it, but now her pain is severe again and she can hardly walk.  Pain radiates down the back of her leg but she has not noticed any numbness or weakness in her leg, no bowel or bladder dysfunction.  She tried to get into physical therapy but cannot for another week.  She wonders what she can do for relief of pain.  She is using Lodine which gives her temporary relief.              ROS: Noncontributory  Objective: Vital Signs: LMP 03/25/2001   Physical Exam:  Right hip: Very tender posterior greater trochanter and piriformis area.  Tender at the right SI joint as well.  Minimal pain with passive internal hip rotation and she has excellent range of motion.  Piriformis stretch causes mild pain.  Lower extremity strength and reflexes remain normal.  Imaging: None today.  Assessment & Plan: 1.  Severe right posterior hip pain, suspect due to foraminal nerve impingement -Hydrocodone as needed.  We will attempt to get epidural steroid injection as soon as possible.  MRI scan if she fails to improve.   Follow-Up Instructions: No follow-ups on file.      Procedures: No procedures performed  No notes on file    PMFS History: Patient Active Problem List   Diagnosis Date Noted  . Bleeding internal hemorrhoids 07/25/2015  . Loose stools 07/25/2015  . EXTERNAL HEMORRHOIDS 06/14/2008   Past Medical History:  Diagnosis Date  . Abnormal Pap smear of  cervix    over 15 yrs ago  . DDD (degenerative disc disease)   . Diverticulosis   . Hemorrhoids   . Inguinal hernia   . Vitamin D deficiency     Family History  Problem Relation Age of Onset  . Leukemia Father   . Hypertension Mother   . COPD Mother   . Colon cancer Neg Hx     Past Surgical History:  Procedure Laterality Date  . BLEPHAROPLASTY  2000  . BREAST ENHANCEMENT SURGERY     x 2  . BUNIONECTOMY     right foot  . COLONOSCOPY    . CRYOTHERAPY     over 15 yrs ago  . HEMORRHOID SURGERY    . INGUINAL HERNIA REPAIR Left   . TONSILLECTOMY     Social History   Occupational History  . Occupation: Radiographer, therapeutic: OTHER  Tobacco Use  . Smoking status: Former Research scientist (life sciences)  . Smokeless tobacco: Never Used  Substance and Sexual Activity  . Alcohol use: Yes    Alcohol/week: 7.0 standard drinks    Types: 7 Standard drinks or equivalent per week  . Drug use: No  . Sexual activity: Not Currently    Partners: Male    Birth control/protection: Post-menopausal

## 2018-04-01 NOTE — Telephone Encounter (Signed)
Patient came into the office stating that she finished the Prednisone and is still in a lot of pain and is needing something to take away the pain.  She stated that it helped her back, but needs something for her right hip pain.  She also stated that she went to make an appointment for the dry needling, but they can not take her til next week.  She can not make it much longer.  CB#[phone].  Patient uses [facility] on [address].  Thank you.

## 2018-04-01 NOTE — Addendum Note (Signed)
Addended by: Hortencia Pilar on: 04/01/2018 12:59 PM   Modules accepted: Orders

## 2018-04-03 ENCOUNTER — Telehealth (INDEPENDENT_AMBULATORY_CARE_PROVIDER_SITE_OTHER): Payer: Self-pay | Admitting: Family Medicine

## 2018-04-03 DIAGNOSIS — M545 Low back pain, unspecified: Secondary | ICD-10-CM

## 2018-04-03 NOTE — Telephone Encounter (Signed)
Tammy Porter  [phone] Please Fax    Please fax script to physical therapist office in [place] patient is in need of a referral would like to start PT

## 2018-04-06 NOTE — Telephone Encounter (Signed)
Orders placed.

## 2018-04-06 NOTE — Telephone Encounter (Signed)
I see no PT referral already in the chart. Please advise.

## 2018-04-07 NOTE — Telephone Encounter (Signed)
Advised patient the order was faxed yesterday afternoon (by Fleeta Emmer). The patient said she will call them to schedule her appointment.

## 2018-04-08 ENCOUNTER — Other Ambulatory Visit: Payer: Self-pay | Admitting: Neurosurgery

## 2018-04-08 ENCOUNTER — Other Ambulatory Visit (HOSPITAL_COMMUNITY): Payer: Self-pay | Admitting: Neurosurgery

## 2018-04-08 DIAGNOSIS — M545 Low back pain: Secondary | ICD-10-CM | POA: Diagnosis not present

## 2018-04-08 DIAGNOSIS — M4316 Spondylolisthesis, lumbar region: Secondary | ICD-10-CM

## 2018-04-08 DIAGNOSIS — M25551 Pain in right hip: Secondary | ICD-10-CM | POA: Diagnosis not present

## 2018-04-08 DIAGNOSIS — M5416 Radiculopathy, lumbar region: Secondary | ICD-10-CM | POA: Diagnosis not present

## 2018-04-14 ENCOUNTER — Encounter (INDEPENDENT_AMBULATORY_CARE_PROVIDER_SITE_OTHER): Payer: Medicare HMO | Admitting: Physical Medicine and Rehabilitation

## 2018-04-15 ENCOUNTER — Other Ambulatory Visit (INDEPENDENT_AMBULATORY_CARE_PROVIDER_SITE_OTHER): Payer: Self-pay | Admitting: Family Medicine

## 2018-04-15 ENCOUNTER — Telehealth (INDEPENDENT_AMBULATORY_CARE_PROVIDER_SITE_OTHER): Payer: Self-pay | Admitting: Family Medicine

## 2018-04-15 DIAGNOSIS — M799 Soft tissue disorder, unspecified: Secondary | ICD-10-CM | POA: Diagnosis not present

## 2018-04-15 DIAGNOSIS — M62838 Other muscle spasm: Secondary | ICD-10-CM | POA: Diagnosis not present

## 2018-04-15 DIAGNOSIS — M5431 Sciatica, right side: Secondary | ICD-10-CM | POA: Diagnosis not present

## 2018-04-15 DIAGNOSIS — M545 Low back pain, unspecified: Secondary | ICD-10-CM

## 2018-04-15 NOTE — Telephone Encounter (Signed)
Verbal authorization given to [facility]. She requested we also fax an order. [phone] - done.

## 2018-04-15 NOTE — Telephone Encounter (Signed)
Megan from [facility] called to request authorization to dry needle the patient.  CB#[phone].  Thank you.

## 2018-04-15 NOTE — Telephone Encounter (Signed)
Orders placed.

## 2018-04-15 NOTE — Telephone Encounter (Signed)
Please advise 

## 2018-04-17 DIAGNOSIS — M62838 Other muscle spasm: Secondary | ICD-10-CM | POA: Diagnosis not present

## 2018-04-17 DIAGNOSIS — M799 Soft tissue disorder, unspecified: Secondary | ICD-10-CM | POA: Diagnosis not present

## 2018-04-17 DIAGNOSIS — M545 Low back pain: Secondary | ICD-10-CM | POA: Diagnosis not present

## 2018-04-17 DIAGNOSIS — M5431 Sciatica, right side: Secondary | ICD-10-CM | POA: Diagnosis not present

## 2018-04-21 ENCOUNTER — Ambulatory Visit (HOSPITAL_COMMUNITY)
Admission: RE | Admit: 2018-04-21 | Discharge: 2018-04-21 | Disposition: A | Discharge status: Home / Self Care | Payer: Medicare HMO | Source: Ambulatory Visit | Attending: Neurosurgery | Admitting: Neurosurgery

## 2018-04-21 DIAGNOSIS — M545 Low back pain: Secondary | ICD-10-CM | POA: Diagnosis not present

## 2018-04-21 DIAGNOSIS — M4316 Spondylolisthesis, lumbar region: Secondary | ICD-10-CM | POA: Diagnosis not present

## 2018-04-21 DIAGNOSIS — Z23 Encounter for immunization: Secondary | ICD-10-CM | POA: Diagnosis not present

## 2018-04-21 DIAGNOSIS — D225 Melanocytic nevi of trunk: Secondary | ICD-10-CM | POA: Diagnosis not present

## 2018-04-21 DIAGNOSIS — L821 Other seborrheic keratosis: Secondary | ICD-10-CM | POA: Diagnosis not present

## 2018-04-21 DIAGNOSIS — L814 Other melanin hyperpigmentation: Secondary | ICD-10-CM | POA: Diagnosis not present

## 2018-04-22 DIAGNOSIS — M545 Low back pain: Secondary | ICD-10-CM | POA: Diagnosis not present

## 2018-04-22 DIAGNOSIS — M799 Soft tissue disorder, unspecified: Secondary | ICD-10-CM | POA: Diagnosis not present

## 2018-04-22 DIAGNOSIS — M5431 Sciatica, right side: Secondary | ICD-10-CM | POA: Diagnosis not present

## 2018-04-22 DIAGNOSIS — M62838 Other muscle spasm: Secondary | ICD-10-CM | POA: Diagnosis not present

## 2018-04-24 DIAGNOSIS — M545 Low back pain: Secondary | ICD-10-CM | POA: Diagnosis not present

## 2018-04-24 DIAGNOSIS — M5431 Sciatica, right side: Secondary | ICD-10-CM | POA: Diagnosis not present

## 2018-04-24 DIAGNOSIS — M799 Soft tissue disorder, unspecified: Secondary | ICD-10-CM | POA: Diagnosis not present

## 2018-04-24 DIAGNOSIS — M62838 Other muscle spasm: Secondary | ICD-10-CM | POA: Diagnosis not present

## 2018-04-27 DIAGNOSIS — M545 Low back pain: Secondary | ICD-10-CM | POA: Diagnosis not present

## 2018-04-27 DIAGNOSIS — M5431 Sciatica, right side: Secondary | ICD-10-CM | POA: Diagnosis not present

## 2018-04-27 DIAGNOSIS — M799 Soft tissue disorder, unspecified: Secondary | ICD-10-CM | POA: Diagnosis not present

## 2018-04-27 DIAGNOSIS — M62838 Other muscle spasm: Secondary | ICD-10-CM | POA: Diagnosis not present

## 2018-05-06 DIAGNOSIS — M62838 Other muscle spasm: Secondary | ICD-10-CM | POA: Diagnosis not present

## 2018-05-06 DIAGNOSIS — M799 Soft tissue disorder, unspecified: Secondary | ICD-10-CM | POA: Diagnosis not present

## 2018-05-06 DIAGNOSIS — M5431 Sciatica, right side: Secondary | ICD-10-CM | POA: Diagnosis not present

## 2018-05-06 DIAGNOSIS — M545 Low back pain: Secondary | ICD-10-CM | POA: Diagnosis not present

## 2018-05-13 DIAGNOSIS — M799 Soft tissue disorder, unspecified: Secondary | ICD-10-CM | POA: Diagnosis not present

## 2018-05-13 DIAGNOSIS — M545 Low back pain: Secondary | ICD-10-CM | POA: Diagnosis not present

## 2018-05-13 DIAGNOSIS — M5431 Sciatica, right side: Secondary | ICD-10-CM | POA: Diagnosis not present

## 2018-05-13 DIAGNOSIS — M62838 Other muscle spasm: Secondary | ICD-10-CM | POA: Diagnosis not present

## 2018-05-28 DIAGNOSIS — R69 Illness, unspecified: Secondary | ICD-10-CM | POA: Diagnosis not present

## 2018-06-02 DIAGNOSIS — J069 Acute upper respiratory infection, unspecified: Secondary | ICD-10-CM | POA: Diagnosis not present

## 2018-06-04 DIAGNOSIS — M5416 Radiculopathy, lumbar region: Secondary | ICD-10-CM | POA: Diagnosis not present

## 2018-07-16 ENCOUNTER — Ambulatory Visit: Payer: Medicare HMO | Admitting: Certified Nurse Midwife

## 2018-08-21 ENCOUNTER — Telehealth: Payer: Self-pay | Admitting: Certified Nurse Midwife

## 2018-08-21 NOTE — Telephone Encounter (Signed)
Left message on voicemail to call and reschedule cancelled appointment. °

## 2018-09-14 ENCOUNTER — Ambulatory Visit (INDEPENDENT_AMBULATORY_CARE_PROVIDER_SITE_OTHER): Payer: Medicare HMO | Admitting: Certified Nurse Midwife

## 2018-09-14 ENCOUNTER — Encounter: Payer: Self-pay | Admitting: Certified Nurse Midwife

## 2018-09-14 ENCOUNTER — Other Ambulatory Visit: Payer: Self-pay

## 2018-09-14 VITALS — BP 102/62 | HR 64 | Temp 97.4°F | Resp 16 | Ht 63.5 in | Wt 124.0 lb

## 2018-09-14 DIAGNOSIS — Z01411 Encounter for gynecological examination (general) (routine) with abnormal findings: Secondary | ICD-10-CM | POA: Diagnosis not present

## 2018-09-14 DIAGNOSIS — Z780 Asymptomatic menopausal state: Secondary | ICD-10-CM

## 2018-09-14 DIAGNOSIS — R590 Localized enlarged lymph nodes: Secondary | ICD-10-CM | POA: Diagnosis not present

## 2018-09-14 DIAGNOSIS — R29890 Loss of height: Secondary | ICD-10-CM | POA: Diagnosis not present

## 2018-09-14 DIAGNOSIS — Z0000 Encounter for general adult medical examination without abnormal findings: Secondary | ICD-10-CM

## 2018-09-14 DIAGNOSIS — N6310 Unspecified lump in the right breast, unspecified quadrant: Secondary | ICD-10-CM

## 2018-09-14 DIAGNOSIS — E559 Vitamin D deficiency, unspecified: Secondary | ICD-10-CM | POA: Diagnosis not present

## 2018-09-14 NOTE — Progress Notes (Signed)
68 y.o. [id number] Single  Caucasian Fe here for annual exam. Post menopausal on progesterone/estrogen with Dr. Janie Morning who is also here PCP. Denies vaginal bleeding or vaginal dryness. Has new granddaughter and has finally seen her through glass. Labs here today. Continues to note nodule in area near underarm that was noted to be silcone from implants. Denies tenderness, soreness or change. Does SBE monthly, no changes that she is aware of. Eating well, starting Yoga classes again outside this week end! No other health concerns today.  Patient's last menstrual period was 03/25/2001.          Sexually active: No.  The current method of family planning is post menopausal status.    Exercising: Yes.    walking & yoga Smoker:  no  Review of Systems  Constitutional: Negative.   HENT: Negative.   Eyes: Negative.   Respiratory: Negative.   Cardiovascular: Negative.   Gastrointestinal: Negative.   Genitourinary: Negative.   Musculoskeletal: Negative.   Skin: Negative.   Neurological: Negative.   Endo/Heme/Allergies: Negative.   Psychiatric/Behavioral: Negative.     Health Maintenance: Pap:  05-26-15 neg, 06-19-17 neg HPV HR neg History of Abnormal Pap: yes MMG:  06-26-2017 bilateral & right breast u/s neg Self Breast exams: yes Colonoscopy:  2011 f/u 76yrs BMD:  2010 normal, pt declined further ones TDaP:  2019 Shingles: 2013 Pneumonia: had done Hep C and HIV: both neg 2017 Labs: negative   reports that she has quit smoking. She has never used smokeless tobacco. She reports current alcohol use of about 7.0 standard drinks of alcohol per week. She reports that she does not use drugs.  Past Medical History:  Diagnosis Date  . Abnormal Pap smear of cervix    over 15 yrs ago  . DDD (degenerative disc disease)   . Diverticulosis   . Hemorrhoids   . Inguinal hernia   . Lumbar herniated disc   . Vitamin D deficiency     Past Surgical History:  Procedure Laterality Date  .  BLEPHAROPLASTY  2000  . BREAST ENHANCEMENT SURGERY     x 2  . BUNIONECTOMY     right foot  . COLONOSCOPY    . CRYOTHERAPY     over 15 yrs ago  . HEMORRHOID SURGERY    . INGUINAL HERNIA REPAIR Left   . TONSILLECTOMY      Current Outpatient Medications  Medication Sig Dispense Refill  . BIOTIN PO Take 10,000 mcg by mouth daily.    . Cholecalciferol (VITAMIN D PO) Take by mouth every other day.    . Cyanocobalamin (B-12) 2000 MCG TABS     . Misc Natural Products (TART CHERRY ADVANCED) CAPS     . Multiple Vitamin (MULTIVITAMIN PO) Take by mouth.    . Omega-3 1000 MG CAPS Take by mouth.    Marland Kitchen PRESCRIPTION MEDICATION Take 1 capsule by mouth daily. Special compound of estriadiol/progesterone. .25-100mg     . UNABLE TO FIND daily. Serene (serotonin support)     No current facility-administered medications for this visit.     Family History  Problem Relation Age of Onset  . Leukemia Father   . Hypertension Mother   . COPD Mother   . Colon cancer Neg Hx     ROS:  Pertinent items are noted in HPI.  Otherwise, a comprehensive ROS was negative.  Exam:   BP 102/62   Pulse 64   Temp (!) 97.4 F (36.3 C) (Skin)   Resp 16  Ht 5' 3.5" (1.613 m)   Wt 124 lb (56.2 kg)   LMP 03/25/2001   BMI 21.62 kg/m  Height: 5' 3.5" (161.3 cm) Ht Readings from Last 3 Encounters:  09/14/18 5' 3.5" (1.613 m)  06/19/17 5' 4.25" (1.632 m)  06/06/16 5' 3.75" (1.619 m)    General appearance: alert, cooperative and appears stated age Head: Normocephalic, without obvious abnormality, atraumatic Neck: no adenopathy, supple, symmetrical, trachea midline and thyroid normal to inspection and palpation Lungs: clear to auscultation bilaterally Breasts: No nipple retraction or dimpling, No nipple discharge or bleeding, normal appearance, in right  breast in area from areola edge to outer edge of breast mound 3-4 scattered pea size masses noted, mobile, non tender patient had noted one. At edge of right axilla  marble size mass noted again.  ? silicone. Left breast no distinct masses noted, but implant palpated, slight pea size mass noted at upper axilla edge similar to right, non tender, mobile. Heart: regular rate and rhythm Abdomen: soft, non-tender; no masses,  no organomegaly Extremities: extremities normal, atraumatic, no cyanosis or edema Skin: Skin color, texture, turgor normal. No rashes or lesions Lymph nodes: Cervical, supraclavicular, and axillary nodes normal, except for what noted with breast exam No abnormal inguinal nodes palpated today Neurologic: Grossly normal   Pelvic: External genitalia:  no lesions, atrophic appearance              Urethra:  normal appearing urethra with no masses, tenderness or lesions              Bartholin's and Skene's: normal                 Vagina: normal appearing vagina with normal color and discharge, no lesions, moisture noted              Cervix: multiparous appearance, no cervical motion tenderness and no lesions              Pap taken: No. Bimanual Exam:  Uterus:  normal size, contour, position, consistency, mobility, non-tender and anteverted              Adnexa: normal adnexa and no mass, fullness, tenderness               Rectovaginal: Confirms               Anus:  normal sphincter tone, no lesions  Chaperone present: yes  A:  Well Woman with normal exam  Post menopausal on HRT with PCP  Right breast masses and axillary mass bilateral ? Silicone as seen before   Height loss, patient has degenerative spine and is aware of BMD over due and declines no more screening  Family history of colon cancer up to date with screening  Screening labs  P:   Reviewed health and wellness pertinent to exam  Patient aware of risks/benefits/and warning signs with HRT use. Aware of need to advise if vaginal bleeding.  Discussed breast finding and need for diagnostic mammogram and [name] if needed as before to make sure no concerns. Patient aware she will be  called with appointment.  Continue being active and yoga to maintain good bone health. Make sure adequate calcium and Vitamin D.  Labs: CMP,Lipid panel, TSH,CBC with diff, Vitamin D  Pap smear: no   counseled on breast self exam, mammography screening, feminine hygiene, osteoporosis, adequate intake of calcium and vitamin D, diet and exercise, Kegel's exercises.   return annually or prn  An After  Visit Summary was printed and given to the patient.

## 2018-09-15 ENCOUNTER — Telehealth: Payer: Self-pay | Admitting: *Deleted

## 2018-09-15 LAB — CBC WITH DIFFERENTIAL/PLATELET
Basophils Absolute: 0 10*3/uL (ref 0.0–0.2)
Basos: 1 %
EOS (ABSOLUTE): 0.1 10*3/uL (ref 0.0–0.4)
Eos: 1 %
Hematocrit: 41.3 % (ref 34.0–46.6)
Hemoglobin: 14.4 g/dL (ref 11.1–15.9)
Immature Grans (Abs): 0 10*3/uL (ref 0.0–0.1)
Immature Granulocytes: 0 %
Lymphocytes Absolute: 2.9 10*3/uL (ref 0.7–3.1)
Lymphs: 56 %
MCH: 32.2 pg (ref 26.6–33.0)
MCHC: 34.9 g/dL (ref 31.5–35.7)
MCV: 92 fL (ref 79–97)
Monocytes Absolute: 0.4 10*3/uL (ref 0.1–0.9)
Monocytes: 8 %
Neutrophils Absolute: 1.8 10*3/uL (ref 1.4–7.0)
Neutrophils: 34 %
Platelets: 209 10*3/uL (ref 150–450)
RBC: 4.47 x10E6/uL (ref 3.77–5.28)
RDW: 13.4 % (ref 11.7–15.4)
WBC: 5.2 10*3/uL (ref 3.4–10.8)

## 2018-09-15 LAB — COMPREHENSIVE METABOLIC PANEL
ALT: 11 IU/L (ref 0–32)
AST: 24 IU/L (ref 0–40)
Albumin/Globulin Ratio: 2.2 (ref 1.2–2.2)
Albumin: 4.4 g/dL (ref 3.8–4.8)
Alkaline Phosphatase: 48 IU/L (ref 39–117)
BUN/Creatinine Ratio: 21 (ref 12–28)
BUN: 14 mg/dL (ref 8–27)
Bilirubin Total: 0.6 mg/dL (ref 0.0–1.2)
CO2: 24 mmol/L (ref 20–29)
Calcium: 9.3 mg/dL (ref 8.7–10.3)
Chloride: 105 mmol/L (ref 96–106)
Creatinine, Ser: 0.68 mg/dL (ref 0.57–1.00)
GFR calc Af Amer: 102 mL/min/{1.73_m2} (ref >59)
GFR calc non Af Amer: 89 mL/min/{1.73_m2} (ref >59)
Globulin, Total: 2 g/dL (ref 1.5–4.5)
Glucose: 88 mg/dL (ref 65–99)
Potassium: 3.9 mmol/L (ref 3.5–5.2)
Sodium: 143 mmol/L (ref 134–144)
Total Protein: 6.4 g/dL (ref 6.0–8.5)

## 2018-09-15 LAB — LIPID PANEL
Chol/HDL Ratio: 2.7 ratio (ref 0.0–4.4)
Cholesterol, Total: 217 mg/dL — ABNORMAL HIGH (ref 100–199)
HDL: 79 mg/dL (ref >39)
LDL Calculated: 120 mg/dL — ABNORMAL HIGH (ref 0–99)
Triglycerides: 91 mg/dL (ref 0–149)
VLDL Cholesterol Cal: 18 mg/dL (ref 5–40)

## 2018-09-15 LAB — VITAMIN D 25 HYDROXY (VIT D DEFICIENCY, FRACTURES): Vit D, 25-Hydroxy: 86.6 ng/mL (ref 30.0–100.0)

## 2018-09-15 LAB — TSH: TSH: 2.85 u[IU]/mL (ref 0.450–4.500)

## 2018-09-15 NOTE — Telephone Encounter (Signed)
Call placed to [facility]. Patient scheduled for bilateral Dx MMG and [name], if needed, on 09/23/18 at 7:45am.   Signed order faxed to [facility].

## 2018-09-15 NOTE — Telephone Encounter (Signed)
Call to patient, left detailed message, ok per dpr. Advised of appt details as seen below at [facility]. If you need to make any changes to your appt, contact [facility] directly at [phone], return call to office if any additional questions.   Routing to provider for final review. Patient is agreeable to disposition. Will close encounter.

## 2018-09-23 ENCOUNTER — Encounter: Payer: Self-pay | Admitting: Certified Nurse Midwife

## 2018-09-23 DIAGNOSIS — N6332 Unspecified lump in axillary tail of the left breast: Secondary | ICD-10-CM | POA: Diagnosis not present

## 2018-09-23 DIAGNOSIS — N6315 Unspecified lump in the right breast, overlapping quadrants: Secondary | ICD-10-CM | POA: Diagnosis not present

## 2018-09-23 DIAGNOSIS — N6331 Unspecified lump in axillary tail of the right breast: Secondary | ICD-10-CM | POA: Diagnosis not present

## 2018-10-01 ENCOUNTER — Telehealth: Payer: Self-pay

## 2018-10-01 NOTE — Telephone Encounter (Signed)
Spoke with patient. Advised we received mammogram results from [place]. This revealed silicone granulomas, same as previous mammogram. [name] still recommends removal of silicone implants or consult with plastic surgeon to discuss. Patient declines at this time and will do yearly mammograms.   1 year mammogram hold per Tammy Porter, CNM  Routed to provider to sign and close encounter.  [facility] for mmg hold

## 2018-10-01 NOTE — Telephone Encounter (Signed)
Received mammogram results from [facility]. Called patient to discuss. Left message to call Estill Bamberg, CMA.(report on my desk).

## 2018-10-01 NOTE — Telephone Encounter (Signed)
Patient returning call.

## 2018-10-05 NOTE — Telephone Encounter (Signed)
Placed in MMG recall. To Dr. Sabra Heck to review.

## 2018-10-06 NOTE — Telephone Encounter (Signed)
Reviewed with Dr. Sabra Heck, ok to remove from [facility] hold.   Copy of report to scan.   Encounter closed.

## 2018-12-12 DIAGNOSIS — R69 Illness, unspecified: Secondary | ICD-10-CM | POA: Diagnosis not present

## 2019-02-04 DIAGNOSIS — M5126 Other intervertebral disc displacement, lumbar region: Secondary | ICD-10-CM | POA: Diagnosis not present

## 2019-02-04 DIAGNOSIS — R946 Abnormal results of thyroid function studies: Secondary | ICD-10-CM | POA: Diagnosis not present

## 2019-02-04 DIAGNOSIS — M8580 Other specified disorders of bone density and structure, unspecified site: Secondary | ICD-10-CM | POA: Diagnosis not present

## 2019-02-04 DIAGNOSIS — Z0000 Encounter for general adult medical examination without abnormal findings: Secondary | ICD-10-CM | POA: Diagnosis not present

## 2019-02-04 DIAGNOSIS — E559 Vitamin D deficiency, unspecified: Secondary | ICD-10-CM | POA: Diagnosis not present

## 2019-02-10 DIAGNOSIS — R69 Illness, unspecified: Secondary | ICD-10-CM | POA: Diagnosis not present

## 2019-02-11 DIAGNOSIS — M5126 Other intervertebral disc displacement, lumbar region: Secondary | ICD-10-CM | POA: Diagnosis not present

## 2019-02-11 DIAGNOSIS — E559 Vitamin D deficiency, unspecified: Secondary | ICD-10-CM | POA: Diagnosis not present

## 2019-02-11 DIAGNOSIS — Z7189 Other specified counseling: Secondary | ICD-10-CM | POA: Diagnosis not present

## 2019-02-11 DIAGNOSIS — R946 Abnormal results of thyroid function studies: Secondary | ICD-10-CM | POA: Diagnosis not present

## 2019-02-11 DIAGNOSIS — Z0000 Encounter for general adult medical examination without abnormal findings: Secondary | ICD-10-CM | POA: Diagnosis not present

## 2019-02-15 DIAGNOSIS — Z20828 Contact with and (suspected) exposure to other viral communicable diseases: Secondary | ICD-10-CM | POA: Diagnosis not present

## 2019-04-02 DIAGNOSIS — R69 Illness, unspecified: Secondary | ICD-10-CM | POA: Diagnosis not present

## 2019-04-15 DIAGNOSIS — H26493 Other secondary cataract, bilateral: Secondary | ICD-10-CM | POA: Diagnosis not present

## 2019-04-15 DIAGNOSIS — H524 Presbyopia: Secondary | ICD-10-CM | POA: Diagnosis not present

## 2019-04-15 DIAGNOSIS — H43813 Vitreous degeneration, bilateral: Secondary | ICD-10-CM | POA: Diagnosis not present

## 2019-04-15 DIAGNOSIS — Z961 Presence of intraocular lens: Secondary | ICD-10-CM | POA: Diagnosis not present

## 2019-04-21 ENCOUNTER — Ambulatory Visit: Payer: Medicare HMO

## 2019-04-29 ENCOUNTER — Ambulatory Visit: Payer: Medicare HMO

## 2019-05-03 DIAGNOSIS — R69 Illness, unspecified: Secondary | ICD-10-CM | POA: Diagnosis not present

## 2019-05-31 DIAGNOSIS — R69 Illness, unspecified: Secondary | ICD-10-CM | POA: Diagnosis not present

## 2019-06-02 DIAGNOSIS — R69 Illness, unspecified: Secondary | ICD-10-CM | POA: Diagnosis not present

## 2019-06-14 DIAGNOSIS — R69 Illness, unspecified: Secondary | ICD-10-CM | POA: Diagnosis not present

## 2019-06-15 ENCOUNTER — Encounter: Payer: Self-pay | Admitting: Certified Nurse Midwife

## 2019-06-30 DIAGNOSIS — R7309 Other abnormal glucose: Secondary | ICD-10-CM | POA: Diagnosis not present

## 2019-06-30 DIAGNOSIS — R946 Abnormal results of thyroid function studies: Secondary | ICD-10-CM | POA: Diagnosis not present

## 2019-06-30 DIAGNOSIS — R69 Illness, unspecified: Secondary | ICD-10-CM | POA: Diagnosis not present

## 2019-06-30 DIAGNOSIS — M5126 Other intervertebral disc displacement, lumbar region: Secondary | ICD-10-CM | POA: Diagnosis not present

## 2019-06-30 DIAGNOSIS — E559 Vitamin D deficiency, unspecified: Secondary | ICD-10-CM | POA: Diagnosis not present

## 2019-06-30 DIAGNOSIS — R1110 Vomiting, unspecified: Secondary | ICD-10-CM | POA: Diagnosis not present

## 2019-06-30 DIAGNOSIS — Z0000 Encounter for general adult medical examination without abnormal findings: Secondary | ICD-10-CM | POA: Diagnosis not present

## 2019-07-12 DIAGNOSIS — R69 Illness, unspecified: Secondary | ICD-10-CM | POA: Diagnosis not present

## 2019-08-19 DIAGNOSIS — R69 Illness, unspecified: Secondary | ICD-10-CM | POA: Diagnosis not present

## 2019-09-16 DIAGNOSIS — M5416 Radiculopathy, lumbar region: Secondary | ICD-10-CM | POA: Diagnosis not present

## 2019-09-21 ENCOUNTER — Ambulatory Visit: Payer: Medicare HMO | Admitting: Certified Nurse Midwife

## 2019-10-21 DIAGNOSIS — M5416 Radiculopathy, lumbar region: Secondary | ICD-10-CM | POA: Diagnosis not present

## 2019-10-21 DIAGNOSIS — M4316 Spondylolisthesis, lumbar region: Secondary | ICD-10-CM | POA: Diagnosis not present

## 2019-11-08 IMAGING — MR MR LUMBAR SPINE W/O CM
4 of 5 series · 26 of 48 positions shown · non-contrast
Comparison: Radiography 04/08/2018.  MRI 05/26/2009.

CLINICAL DATA: Severe back pain. Right hip and buttock pain.
Duration of symptoms 2 months.

EXAM:
MRI LUMBAR SPINE WITHOUT CONTRAST
TECHNIQUE: Multiplanar, multisequence MR imaging of the lumbar spine was
performed. No intravenous contrast was administered.

[Series 5: T2 · sagittal · 4.0mm · 0.73mm/px · 6 of 16 slices shown (1 of 2)]
[im 1/16]
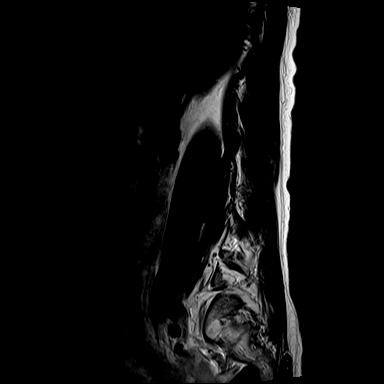
[im 4/16]
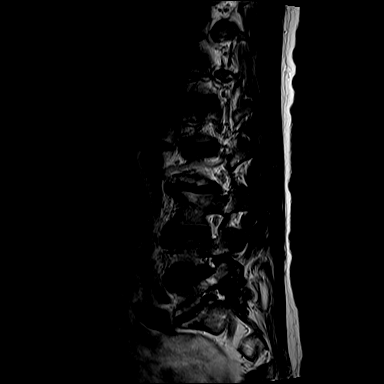
[im 7/16]
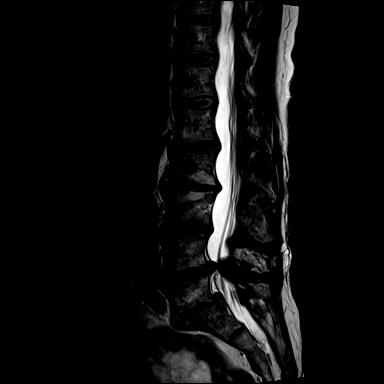
[im 10/16]
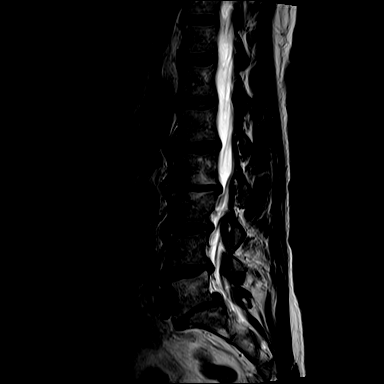
[im 13/16]
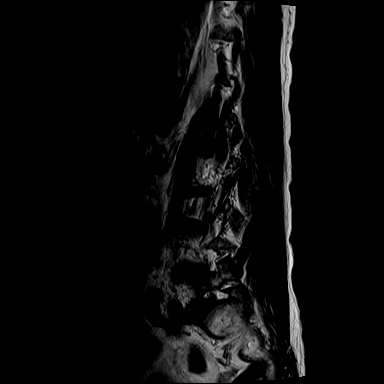
[im 16/16]
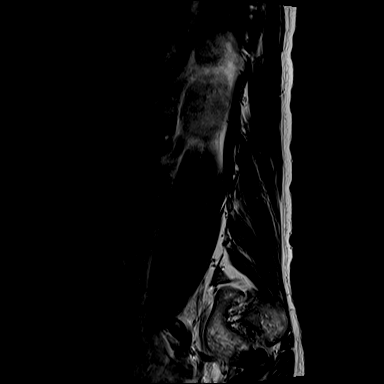

[Series 7: T1 · sagittal · 4.0mm · 0.88mm/px · 7 of 16 slices shown (1 of 2)]
[im 1/16]
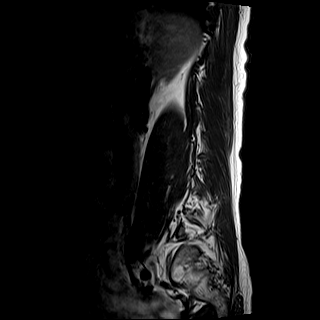
[im 3/16]
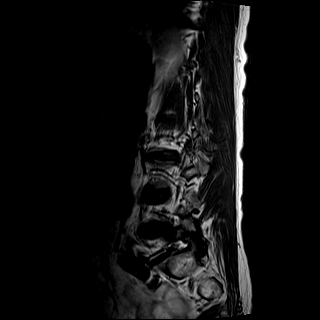
[im 6/16]
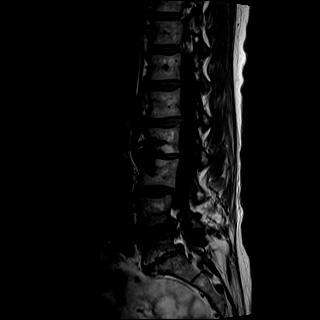
[im 8/16]
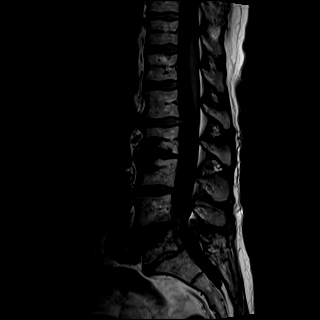
[im 11/16]
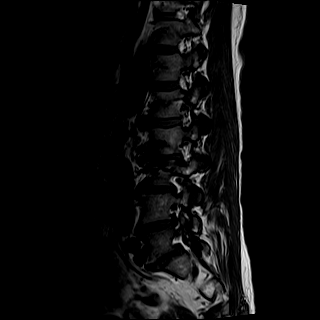
[im 13/16]
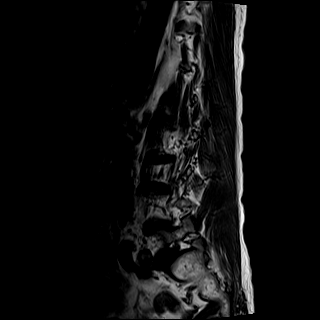
[im 16/16]
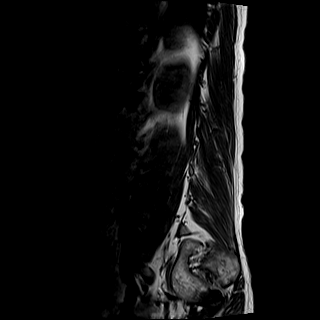

[Series 8: T2 · axial · 4.0mm · 0.57mm/px · z∈[-109,+67]mm · 8 of 32 slices shown (2 of 2)]
[im 1/32]
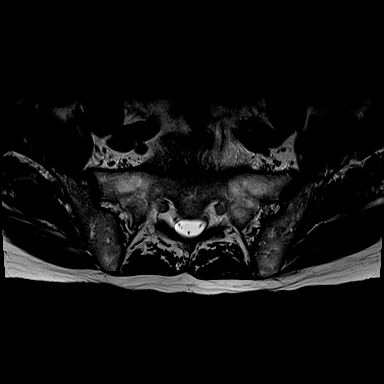
[im 5/32]
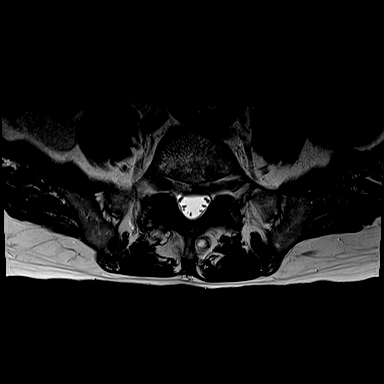
[im 10/32]
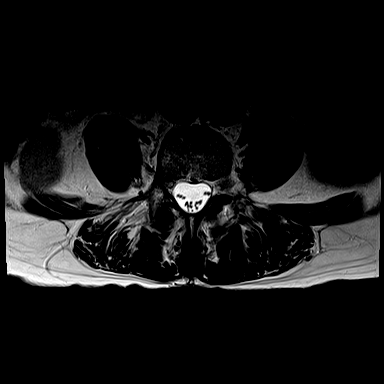
[im 15/32]
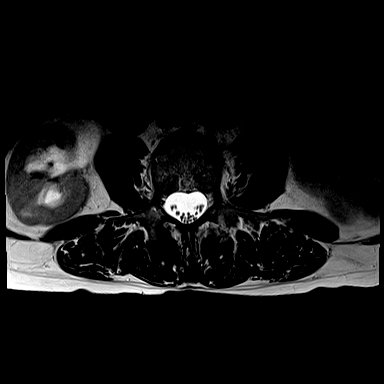
[im 17/32]
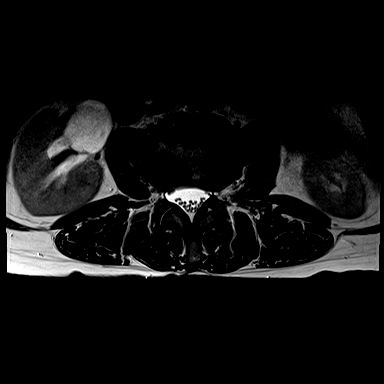
[im 22/32]
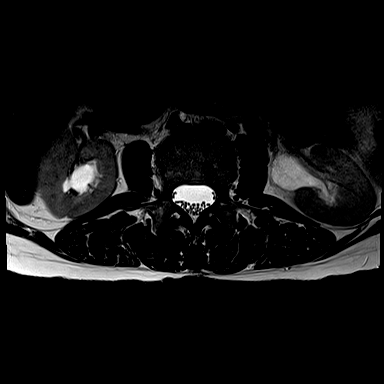
[im 27/32]
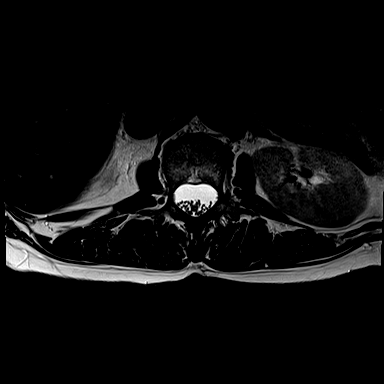
[im 32/32]
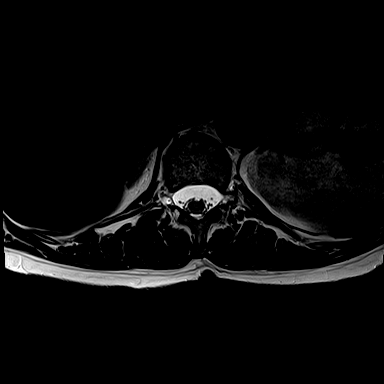

[Series 9: T1 · axial · 4.0mm · 0.34mm/px · z∈[-109,+42]mm · 5 of 32 slices shown (2 of 2)]
[im 1/32]
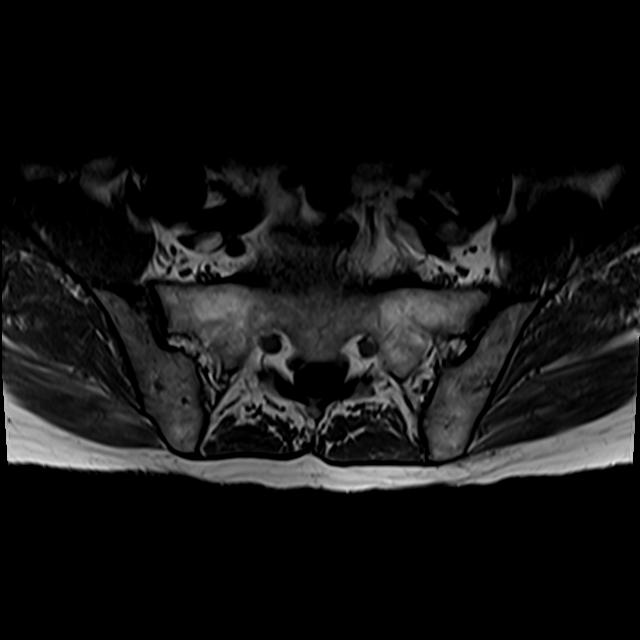
[im 5/32]
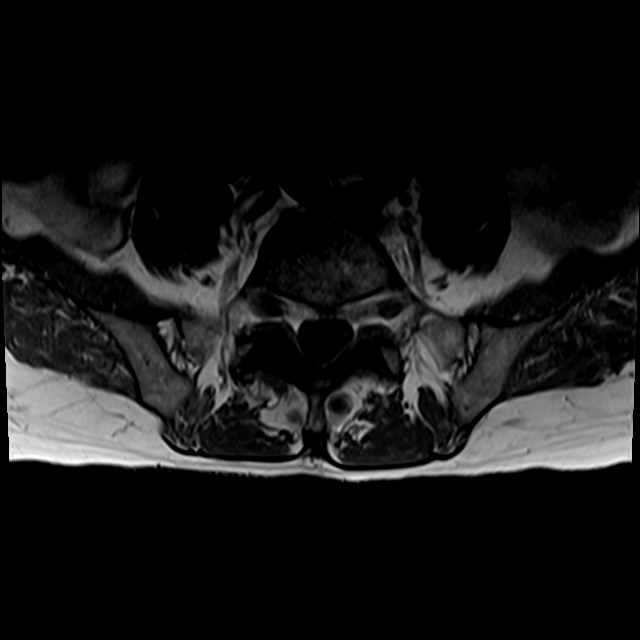
[im 10/32]
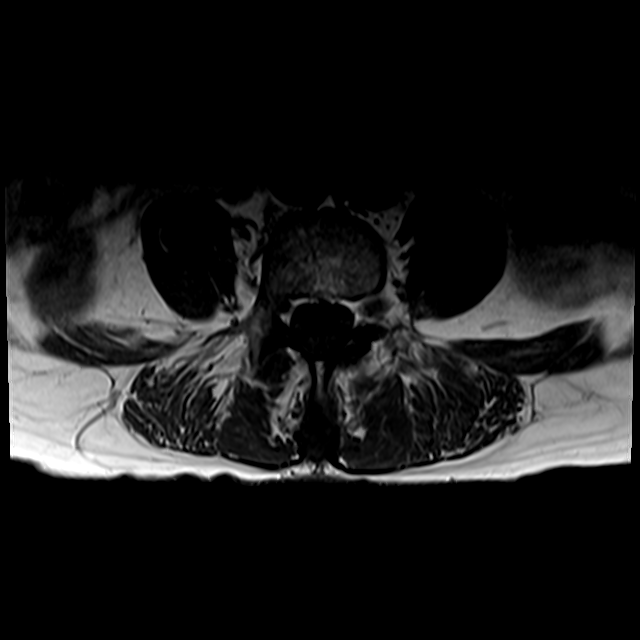
[im 17/32]
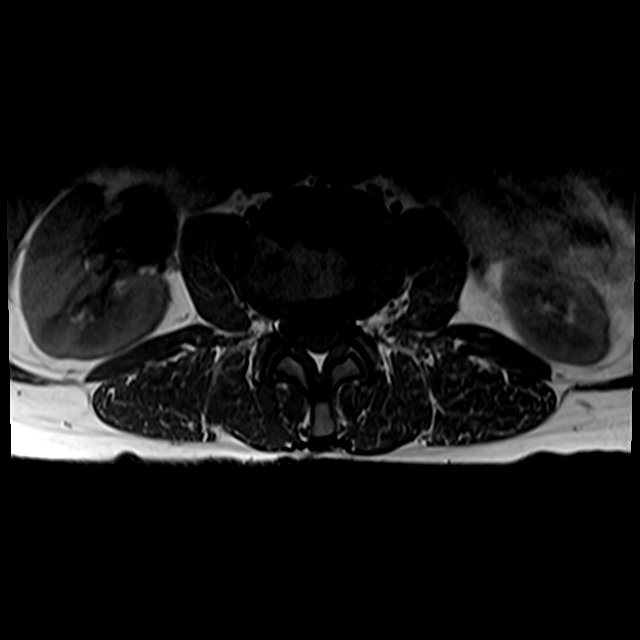
[im 27/32]
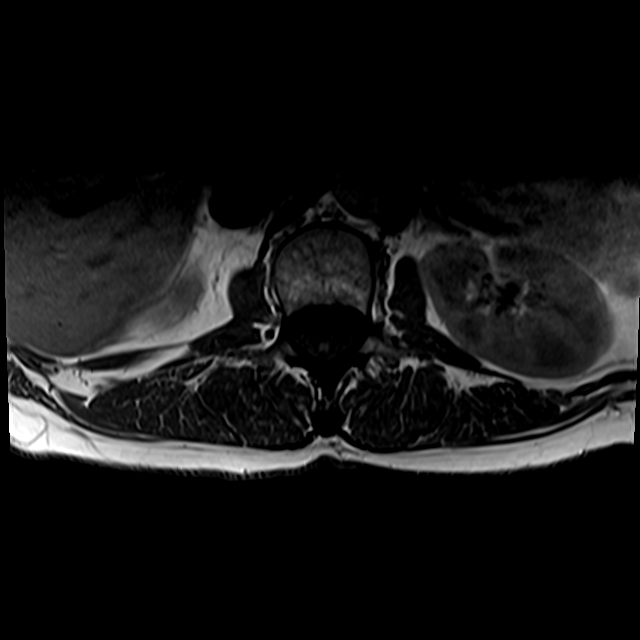

[26 of 48 positions shown; findings below may reference images not displayed]

FINDINGS: Segmentation:  5 lumbar type vertebral bodies assumed.

Alignment:  7 mm anterolisthesis L5-S1.

Vertebrae: Endplate edema at L4-5, particularly of the superior
endplate of L5. I think this is probably discogenic rather than
related to a true fracture.

Conus medullaris and cauda equina: Conus extends to the L1 level.
Conus and cauda equina appear normal.

Paraspinal and other soft tissues: Negative

Disc levels:

No significant finding at T12-L1 or above.

L1-2: Minimal disc bulge.  No stenosis.

L2-3: Disc degeneration with endplate osteophytes and mild bulging
of the disc. No compressive stenosis.

L3-4: Mild bulging of the disc.  No compressive stenosis.

L4-5: Disc degeneration with shallow protrusion more prominent in
the right posterolateral direction. Facet and ligamentous
hypertrophy more prominent on the right. Stenosis of the lateral
recesses much worse on the right than the left. Right-sided nerve
root compression is likely, particularly affecting the right L5
nerve. Foraminal stenosis could also affect the exiting right L4
nerve. Bone marrow edematous changes could be associated with
localized right low back pain.

L5-S1: Chronic facet arthropathy with 7 mm of anterolisthesis. Can
not exclude the possibility of a pars defect particularly on the
left. Chronic disc degeneration with pseudo disc herniation. No
central canal stenosis. Bilateral foraminal narrowing that would
have some potential to affect either L5 nerve.
IMPRESSION: Marked worsening of the findings at L4-5. Disc degeneration more
pronounced on the right. Disc protrusion more prominent towards the
right. Facet and ligamentous hypertrophy worse on the right.
Stenosis of the right lateral recess quite likely to compress the
right L5 nerve. The exiting right L4 nerve could also be affected.
Discogenic edematous changes and facet edema on the right at L4-5
could also contribute to regional back pain.

L5-S1 chronic facet arthropathy and or pars defects with 7 mm of
anterolisthesis, increased since [id number]. Bilateral foraminal narrowing
that would have some potential to affect the exiting L5 nerve on
either or both sides, more likely the right.

## 2019-12-26 DIAGNOSIS — R69 Illness, unspecified: Secondary | ICD-10-CM | POA: Diagnosis not present

## 2020-01-13 DIAGNOSIS — Z9849 Cataract extraction status, unspecified eye: Secondary | ICD-10-CM | POA: Diagnosis not present

## 2020-01-13 DIAGNOSIS — Z961 Presence of intraocular lens: Secondary | ICD-10-CM | POA: Diagnosis not present

## 2020-01-13 DIAGNOSIS — H52221 Regular astigmatism, right eye: Secondary | ICD-10-CM | POA: Diagnosis not present

## 2020-01-13 DIAGNOSIS — H524 Presbyopia: Secondary | ICD-10-CM | POA: Diagnosis not present

## 2020-01-13 DIAGNOSIS — H5213 Myopia, bilateral: Secondary | ICD-10-CM | POA: Diagnosis not present

## 2020-01-13 DIAGNOSIS — H43393 Other vitreous opacities, bilateral: Secondary | ICD-10-CM | POA: Diagnosis not present

## 2020-01-14 DIAGNOSIS — Z0100 Encounter for examination of eyes and vision without abnormal findings: Secondary | ICD-10-CM | POA: Diagnosis not present

## 2020-01-18 LAB — IFOBT (OCCULT BLOOD): IFOBT: NEGATIVE

## 2020-02-02 ENCOUNTER — Encounter: Payer: Self-pay | Admitting: Family Medicine

## 2020-02-11 ENCOUNTER — Other Ambulatory Visit: Payer: Self-pay

## 2020-02-11 ENCOUNTER — Encounter: Payer: Self-pay | Admitting: Podiatry

## 2020-02-11 ENCOUNTER — Ambulatory Visit: Payer: Medicare HMO | Admitting: Podiatry

## 2020-02-11 DIAGNOSIS — B070 Plantar wart: Secondary | ICD-10-CM | POA: Diagnosis not present

## 2020-02-11 DIAGNOSIS — L600 Ingrowing nail: Secondary | ICD-10-CM

## 2020-02-15 ENCOUNTER — Encounter: Payer: Self-pay | Admitting: Podiatry

## 2020-02-15 NOTE — Progress Notes (Signed)
Subjective:  Patient ID: Tammy Porter, female    DOB: [birth year],  MRN: [id number]  Chief Complaint  Patient presents with  . Ingrown Toenail    right hallux ingrown   . Callouses    Left foot corn between big toe and 2nd toe     69 y.o. female presents with the above complaint.  Patient presents with complaint of right medial border ingrown.  Patient states is painful to touch.  She would like to have it removed.  She says hurts with ambulating.  She also has secondary complaint of left second digit corn that has been hurting her with every step as well.  She has tried callus pad and removal which has not helped.  She was to get it evaluated and get it treated.  She denies any other acute complaints.   Review of Systems: Negative except as noted in the HPI. Denies N/V/F/Ch.  Past Medical History:  Diagnosis Date  . Abnormal Pap smear of cervix    over 15 yrs ago  . DDD (degenerative disc disease)   . Diverticulosis   . Hemorrhoids   . Inguinal hernia   . Lumbar herniated disc   . Vitamin D deficiency     Current Outpatient Medications:  .  BIOTIN PO, Take 10,000 mcg by mouth daily., Disp: , Rfl:  .  Cholecalciferol (VITAMIN D PO), Take by mouth every other day., Disp: , Rfl:  .  Cyanocobalamin (B-12) 2000 MCG TABS, , Disp: , Rfl:  .  escitalopram (LEXAPRO) 10 MG tablet, Take 10 mg by mouth daily., Disp: , Rfl:  .  Misc Natural Products (TART CHERRY ADVANCED) CAPS, , Disp: , Rfl:  .  Multiple Vitamin (MULTIVITAMIN PO), Take by mouth., Disp: , Rfl:  .  Omega-3 1000 MG CAPS, Take by mouth., Disp: , Rfl:  .  PRESCRIPTION MEDICATION, Take 1 capsule by mouth daily. Special compound of estriadiol/progesterone. .25-100mg , Disp: , Rfl:  .  UNABLE TO FIND, daily. Serene (serotonin support), Disp: , Rfl:   Social History   Tobacco Use  Smoking Status Former Smoker  Smokeless Tobacco Never Used    No Known Allergies Objective:  There were no vitals filed for this visit. There is  no height or weight on file to calculate BMI. Constitutional Well developed. Well nourished.  Vascular Dorsalis pedis pulses palpable bilaterally. Posterior tibial pulses palpable bilaterally. Capillary refill normal to all digits.  No cyanosis or clubbing noted. Pedal hair growth normal.  Neurologic Normal speech. Oriented to person, place, and time. Epicritic sensation to light touch grossly present bilaterally.  Dermatologic Painful ingrowing nail at medial nail borders of the hallux nail right. No other open wounds. Skin lesion/plantar verruca with pinpoint bleeding noted to the left second digit.right  Orthopedic: Normal joint ROM without pain or crepitus bilaterally. No visible deformities. No bony tenderness.   Radiographs: None Assessment:   1. Ingrown toenail of right foot   2. Plantar verruca    Plan:  Patient was evaluated and treated and all questions answered.  Ingrown Nail, right -Patient elects to proceed with minor surgery to remove ingrown toenail removal today. Consent reviewed and signed by patient. -Ingrown nail excised. See procedure note. -Educated on post-procedure care including soaking. Written instructions provided and reviewed. -Patient to follow up in 2 weeks for nail check.  Left second digit plantar verruca -I explained patient the etiology of plantar verruca versus treatment options were discussed.  Given that the office is out of Cantharone therapy  I believe she will benefit from laser therapy.  She will be scheduled to see Caryl Pina for laser.  Procedure: Excision of Ingrown Toenail Location: Right 1st toe medial nail borders. Anesthesia: Lidocaine 1% plain; 1.5 mL and Marcaine 0.5% plain; 1.5 mL, digital block. Skin Prep: Betadine. Dressing: Silvadene; telfa; dry, sterile, compression dressing. Technique: Following skin prep, the toe was exsanguinated and a tourniquet was secured at the base of the toe. The affected nail border was freed, split  with a nail splitter, and excised. Chemical matrixectomy was then performed with phenol and irrigated out with alcohol. The tourniquet was then removed and sterile dressing applied. Disposition: Patient tolerated procedure well. Patient to return in 2 weeks for follow-up.   No follow-ups on file.

## 2020-02-16 DIAGNOSIS — R946 Abnormal results of thyroid function studies: Secondary | ICD-10-CM | POA: Diagnosis not present

## 2020-02-16 DIAGNOSIS — E559 Vitamin D deficiency, unspecified: Secondary | ICD-10-CM | POA: Diagnosis not present

## 2020-02-16 DIAGNOSIS — R7309 Other abnormal glucose: Secondary | ICD-10-CM | POA: Diagnosis not present

## 2020-02-16 DIAGNOSIS — Z0000 Encounter for general adult medical examination without abnormal findings: Secondary | ICD-10-CM | POA: Diagnosis not present

## 2020-02-24 DIAGNOSIS — M5126 Other intervertebral disc displacement, lumbar region: Secondary | ICD-10-CM | POA: Diagnosis not present

## 2020-02-24 DIAGNOSIS — M8580 Other specified disorders of bone density and structure, unspecified site: Secondary | ICD-10-CM | POA: Diagnosis not present

## 2020-02-24 DIAGNOSIS — R946 Abnormal results of thyroid function studies: Secondary | ICD-10-CM | POA: Diagnosis not present

## 2020-02-24 DIAGNOSIS — Z23 Encounter for immunization: Secondary | ICD-10-CM | POA: Diagnosis not present

## 2020-02-24 DIAGNOSIS — E559 Vitamin D deficiency, unspecified: Secondary | ICD-10-CM | POA: Diagnosis not present

## 2020-02-24 DIAGNOSIS — R7309 Other abnormal glucose: Secondary | ICD-10-CM | POA: Diagnosis not present

## 2020-02-24 DIAGNOSIS — R69 Illness, unspecified: Secondary | ICD-10-CM | POA: Diagnosis not present

## 2020-02-24 DIAGNOSIS — Z0000 Encounter for general adult medical examination without abnormal findings: Secondary | ICD-10-CM | POA: Diagnosis not present

## 2020-02-24 DIAGNOSIS — Z79890 Hormone replacement therapy (postmenopausal): Secondary | ICD-10-CM | POA: Diagnosis not present

## 2020-03-06 ENCOUNTER — Ambulatory Visit (INDEPENDENT_AMBULATORY_CARE_PROVIDER_SITE_OTHER): Payer: Medicare HMO | Admitting: *Deleted

## 2020-03-06 ENCOUNTER — Other Ambulatory Visit: Payer: Self-pay

## 2020-03-06 DIAGNOSIS — B070 Plantar wart: Secondary | ICD-10-CM

## 2020-03-06 NOTE — Progress Notes (Signed)
Patient presents today for laser treatment for plantar warts on the 2nd toe, medial aspect, left foot. There are 2 lesions.  Dr. Posey Pronto patient.  All other systems are negative.  Lesions were debrided superficially. Laser therapy was administered to the 2nd toe left foot. The patient tolerated the treatment well. All safety precautions were in place.   Follow up in 2 weeks for laser # 2.  Dr. Posey Pronto is offering this alternative treatment until the cantharone comes in.  ~Pic of lesion taken today~

## 2020-03-06 NOTE — Patient Instructions (Signed)

## 2020-03-22 ENCOUNTER — Ambulatory Visit (INDEPENDENT_AMBULATORY_CARE_PROVIDER_SITE_OTHER): Payer: Medicare HMO

## 2020-03-22 ENCOUNTER — Other Ambulatory Visit: Payer: Self-pay

## 2020-03-22 DIAGNOSIS — B070 Plantar wart: Secondary | ICD-10-CM | POA: Diagnosis not present

## 2020-03-22 NOTE — Progress Notes (Signed)
Patient presents today for laser treatment for plantar warts on the 2nd toe, medial aspect, left foot. There are 2 lesions.  Dr. Allena Katz patient.  All other systems are negative.  Lesions were debrided superficially. Laser therapy was administered to the 2nd toe left foot. The patient tolerated the treatment well. All safety precautions were in place.   Follow up in 2 weeks for canthrone treatment with Dr.Patel.  Dr. Allena Katz is offering this alternative treatment until the cantharone comes in.

## 2020-04-05 ENCOUNTER — Ambulatory Visit (INDEPENDENT_AMBULATORY_CARE_PROVIDER_SITE_OTHER): Payer: Medicare HMO | Admitting: Podiatry

## 2020-04-05 ENCOUNTER — Other Ambulatory Visit: Payer: Self-pay

## 2020-04-05 ENCOUNTER — Encounter: Payer: Self-pay | Admitting: Podiatry

## 2020-04-05 DIAGNOSIS — B070 Plantar wart: Secondary | ICD-10-CM

## 2020-04-05 NOTE — Progress Notes (Signed)
  Subjective:  Patient ID: Tammy Porter, female    DOB: July 16, 1950,  MRN: [id number]  Chief Complaint  Patient presents with  . Callouses    Left foot 2nd toe. Corn.     70 y.o. female presents with the above complaint.  Patient presents with a follow-up of left second digit plantar verruca.  Patient states that there is no pain associate with it.  She is done multiple rounds of laser which has not helped.  She would like to discuss if she can now do Cantharone therapy.   Review of Systems: Negative except as noted in the HPI. Denies N/V/F/Ch.  Past Medical History:  Diagnosis Date  . Abnormal Pap smear of cervix    over 15 yrs ago  . DDD (degenerative disc disease)   . Diverticulosis   . Hemorrhoids   . Inguinal hernia   . Lumbar herniated disc   . Vitamin D deficiency     Current Outpatient Medications:  .  BIOTIN PO, Take 10,000 mcg by mouth daily., Disp: , Rfl:  .  Cholecalciferol (VITAMIN D PO), Take by mouth every other day., Disp: , Rfl:  .  Cyanocobalamin (B-12) 2000 MCG TABS, , Disp: , Rfl:  .  escitalopram (LEXAPRO) 10 MG tablet, Take 10 mg by mouth daily., Disp: , Rfl:  .  Misc Natural Products (TART CHERRY ADVANCED) CAPS, , Disp: , Rfl:  .  Multiple Vitamin (MULTIVITAMIN PO), Take by mouth., Disp: , Rfl:  .  Omega-3 1000 MG CAPS, Take by mouth., Disp: , Rfl:  .  PRESCRIPTION MEDICATION, Take 1 capsule by mouth daily. Special compound of estriadiol/progesterone. .25-100mg , Disp: , Rfl:  .  UNABLE TO FIND, daily. Serene (serotonin support), Disp: , Rfl:   Social History   Tobacco Use  Smoking Status Former Smoker  Smokeless Tobacco Never Used    No Known Allergies Objective:  There were no vitals filed for this visit. There is no height or weight on file to calculate BMI. Constitutional Well developed. Well nourished.  Vascular Dorsalis pedis pulses palpable bilaterally. Posterior tibial pulses palpable bilaterally. Capillary refill normal to all digits.  No  cyanosis or clubbing noted. Pedal hair growth normal.  Neurologic Normal speech. Oriented to person, place, and time. Epicritic sensation to light touch grossly present bilaterally.  Dermatologic  no further painful ingrowing nail at medial nail borders of the hallux nail right. No other open wounds. Skin lesion/plantar verruca with pinpoint bleeding noted to the left second digit.right  Orthopedic: Normal joint ROM without pain or crepitus bilaterally. No visible deformities. No bony tenderness.   Radiographs: None Assessment:   No diagnosis found. Plan:  Patient was evaluated and treated and all questions answered.  Ingrown Nail, right -Clinically healed  Left second digit plantar verruca -I explained patient the etiology of plantar verruca versus treatment options were discussed.  I discussed with the patient all the complications and risk associate with Cantharone therapy and she would like to proceed with the [facility] therapy.  I discussed with her takes about 3 applications.  Today will be the first out of 3. --Lesion was debrided today without complications. Hemostasis was achieved and the area was cleaned. Cantharone was applied followed by an occlusive bandage. Post procedure complications were discussed. Monitor for signs or symptoms of infection and directed to call the office mainly should any occur.   No follow-ups on file.

## 2020-04-19 ENCOUNTER — Ambulatory Visit: Payer: Medicare HMO | Admitting: Podiatry

## 2020-04-21 ENCOUNTER — Encounter: Payer: Self-pay | Admitting: Internal Medicine

## 2020-06-02 DIAGNOSIS — Z1231 Encounter for screening mammogram for malignant neoplasm of breast: Secondary | ICD-10-CM | POA: Diagnosis not present

## 2020-07-21 ENCOUNTER — Other Ambulatory Visit: Payer: Self-pay

## 2020-07-21 ENCOUNTER — Ambulatory Visit (AMBULATORY_SURGERY_CENTER): Payer: Self-pay

## 2020-07-21 VITALS — Ht 64.5 in | Wt 126.0 lb

## 2020-07-21 DIAGNOSIS — Z1211 Encounter for screening for malignant neoplasm of colon: Secondary | ICD-10-CM

## 2020-07-21 NOTE — Progress Notes (Signed)
No egg or soy allergy known to patient  No issues with past sedation with any surgeries or procedures Patient denies ever being told they had issues or difficulty with intubation  No FH of Malignant Hyperthermia No diet pills per patient No home 02 use per patient  No blood thinners per patient  Pt denies issues with constipation  No A fib or A flutter  EMMI video via MyChart  COVID 19 guidelines implemented in [place] today with Pt and RN  Pt is fully vaccinated for Covid x 2 + booster;  Discussed with pt there will be an out-of-pocket cost for prep and that varies from $0 to 70 dollars  Due to the COVID-19 pandemic we are asking patients to follow certain guidelines.  Pt aware of COVID protocols and [facility] guidelines   

## 2020-07-28 DIAGNOSIS — L578 Other skin changes due to chronic exposure to nonionizing radiation: Secondary | ICD-10-CM | POA: Diagnosis not present

## 2020-07-28 DIAGNOSIS — D485 Neoplasm of uncertain behavior of skin: Secondary | ICD-10-CM | POA: Diagnosis not present

## 2020-07-28 DIAGNOSIS — L820 Inflamed seborrheic keratosis: Secondary | ICD-10-CM | POA: Diagnosis not present

## 2020-07-28 DIAGNOSIS — L821 Other seborrheic keratosis: Secondary | ICD-10-CM | POA: Diagnosis not present

## 2020-07-28 DIAGNOSIS — L814 Other melanin hyperpigmentation: Secondary | ICD-10-CM | POA: Diagnosis not present

## 2020-07-28 DIAGNOSIS — D225 Melanocytic nevi of trunk: Secondary | ICD-10-CM | POA: Diagnosis not present

## 2020-08-02 ENCOUNTER — Encounter: Payer: Self-pay | Admitting: Internal Medicine

## 2020-09-18 DIAGNOSIS — H52221 Regular astigmatism, right eye: Secondary | ICD-10-CM | POA: Diagnosis not present

## 2020-09-18 DIAGNOSIS — H524 Presbyopia: Secondary | ICD-10-CM | POA: Diagnosis not present

## 2020-09-27 ENCOUNTER — Encounter: Payer: Self-pay | Admitting: Internal Medicine

## 2020-09-29 ENCOUNTER — Other Ambulatory Visit: Payer: Self-pay

## 2020-09-29 ENCOUNTER — Encounter: Payer: Self-pay | Admitting: Internal Medicine

## 2020-09-29 ENCOUNTER — Ambulatory Visit (AMBULATORY_SURGERY_CENTER): Payer: Medicare HMO | Admitting: Internal Medicine

## 2020-09-29 VITALS — BP 123/72 | HR 50 | Temp 98.6°F | Resp 17 | Ht 64.5 in | Wt 126.0 lb

## 2020-09-29 DIAGNOSIS — D122 Benign neoplasm of ascending colon: Secondary | ICD-10-CM | POA: Diagnosis not present

## 2020-09-29 DIAGNOSIS — Z1211 Encounter for screening for malignant neoplasm of colon: Secondary | ICD-10-CM | POA: Diagnosis not present

## 2020-09-29 DIAGNOSIS — D123 Benign neoplasm of transverse colon: Secondary | ICD-10-CM

## 2020-09-29 DIAGNOSIS — Z860101 Personal history of adenomatous and serrated colon polyps: Secondary | ICD-10-CM

## 2020-09-29 DIAGNOSIS — Z86010 Personal history of colonic polyps: Secondary | ICD-10-CM

## 2020-09-29 HISTORY — DX: Personal history of colonic polyps: Z86.010

## 2020-09-29 HISTORY — DX: Personal history of adenomatous and serrated colon polyps: Z86.0101

## 2020-09-29 MED ORDER — SODIUM CHLORIDE 0.9 % IV SOLN: 500.0000 mL | Freq: Once | INTRAVENOUS | Status: DC

## 2020-09-29 NOTE — Progress Notes (Signed)
Called to room to assist during endoscopic procedure.  Patient ID and intended procedure confirmed with present staff. Received instructions for my participation in the procedure from the performing physician.  

## 2020-09-29 NOTE — Op Note (Signed)
[facility] Patient Name: Tammy Porter Procedure Date: 09/29/2020 10:59 AM MRN: [id number] Endoscopist: Gatha Mayer , MD Age: 70 Referring MD:  Date of Birth: March 01, 1951 Gender: Female Account #: [id number] Procedure:                Colonoscopy Indications:              Screening for colorectal malignant neoplasm, Last                            colonoscopy: 2011 Medicines:                Propofol per Anesthesia, Monitored Anesthesia Care Procedure:                Pre-Anesthesia Assessment:                           - Prior to the procedure, a History and Physical                            was performed, and patient medications and                            allergies were reviewed. The patient's tolerance of                            previous anesthesia was also reviewed. The risks                            and benefits of the procedure and the sedation                            options and risks were discussed with the patient.                            All questions were answered, and informed consent                            was obtained. Prior Anticoagulants: The patient has                            taken no previous anticoagulant or antiplatelet                            agents. ASA Grade Assessment: II - A patient with                            mild systemic disease. After reviewing the risks                            and benefits, the patient was deemed in                            satisfactory condition to undergo the procedure.  After obtaining informed consent, the colonoscope                            was passed under direct vision. Throughout the                            procedure, the patient's blood pressure, pulse, and                            oxygen saturations were monitored continuously. The                            Olympus PCF-H190DL (#[id number]) Colonoscope was                            introduced through the  anus and advanced to the the                            cecum, identified by appendiceal orifice and                            ileocecal valve. The colonoscopy was performed                            without difficulty. The patient tolerated the                            procedure well. The quality of the bowel                            preparation was good. The ileocecal valve,                            appendiceal orifice, and rectum were photographed.                            The bowel preparation used was Miralax via split                            dose instruction. Scope In: 11:13:12 AM Scope Out: 11:30:10 AM Scope Withdrawal Time: 0 hours 8 minutes 40 seconds  Total Procedure Duration: 0 hours 16 minutes 58 seconds  Findings:                 The perianal and digital rectal examinations were                            normal.                           Three flat and sessile polyps were found in the                            transverse colon and ascending colon. The polyps  were 2 to 10 mm in size. These polyps were removed                            with a cold snare. Resection and retrieval were                            complete. Verification of patient identification                            for the specimen was done. Estimated blood loss was                            minimal.                           Multiple small-mouthed diverticula were found in                            the sigmoid colon.                           External and internal hemorrhoids were found. The                            hemorrhoids were small.                           The exam was otherwise without abnormality on                            direct and retroflexion views. Complications:            No immediate complications. Estimated Blood Loss:     Estimated blood loss was minimal. Impression:               - Three 2 to 10 mm polyps in the transverse colon                             and in the ascending colon, removed with a cold                            snare. Resected and retrieved.                           - Diverticulosis in the sigmoid colon.                           - External and internal hemorrhoids.                           - The examination was otherwise normal on direct                            and retroflexion views. Recommendation:           - Patient has a contact number available for  emergencies. The signs and symptoms of potential                            delayed complications were discussed with the                            patient. Return to normal activities tomorrow.                            Written discharge instructions were provided to the                            patient.                           - Resume previous diet.                           - Continue present medications.                           - Repeat colonoscopy is recommended. The                            colonoscopy date will be determined after pathology                            results from today's exam become available for                            review. Gatha Mayer, MD 09/29/2020 11:38:46 AM This report has been signed electronically.

## 2020-09-29 NOTE — Progress Notes (Signed)
PT taken to PACU. Monitors in place. VSS. Report given to RN. 

## 2020-09-29 NOTE — Patient Instructions (Addendum)
I found and removed 3 polyps today - all small and look benign.  You also have a condition called diverticulosis - common and not usually a problem. Please read the handout provided.  Hemorrhoids slightly inflamed from prep  not a surprise.  I will let you know pathology results and when to have another routine colonoscopy by mail and/or My Chart.  I appreciate the opportunity to care for you. Gatha Mayer, MD, [facility]  Handouts given for diverticulosis and polyps.   YOU HAD AN ENDOSCOPIC PROCEDURE TODAY AT [facility]:   Refer to the procedure report that was given to you for any specific questions about what was found during the examination.  If the procedure report does not answer your questions, please call your gastroenterologist to clarify.  If you requested that your care partner not be given the details of your procedure findings, then the procedure report has been included in a sealed envelope for you to review at your convenience later.  YOU SHOULD EXPECT: Some feelings of bloating in the abdomen. Passage of more gas than usual.  Walking can help get rid of the air that was put into your GI tract during the procedure and reduce the bloating. If you had a lower endoscopy (such as a colonoscopy or flexible sigmoidoscopy) you may notice spotting of blood in your stool or on the toilet paper. If you underwent a bowel prep for your procedure, you may not have a normal bowel movement for a few days.  Please Note:  You might notice some irritation and congestion in your nose or some drainage.  This is from the oxygen used during your procedure.  There is no need for concern and it should clear up in a day or so.  SYMPTOMS TO REPORT IMMEDIATELY:  Following lower endoscopy (colonoscopy or flexible sigmoidoscopy):  Excessive amounts of blood in the stool  Significant tenderness or worsening of abdominal pains  Swelling of the abdomen that is new, acute  Fever of 100F or  higher  For urgent or emergent issues, a gastroenterologist can be reached at any hour by calling [phone]. Do not use MyChart messaging for urgent concerns.    DIET:  We do recommend a small meal at first, but then you may proceed to your regular diet.  Drink plenty of fluids but you should avoid alcoholic beverages for 24 hours.  ACTIVITY:  You should plan to take it easy for the rest of today and you should NOT DRIVE or use heavy machinery until tomorrow (because of the sedation medicines used during the test).    FOLLOW UP: Our staff will call the number listed on your records 48-72 hours following your procedure to check on you and address any questions or concerns that you may have regarding the information given to you following your procedure. If we do not reach you, we will leave a message.  We will attempt to reach you two times.  During this call, we will ask if you have developed any symptoms of COVID 19. If you develop any symptoms (ie: fever, flu-like symptoms, shortness of breath, cough etc.) before then, please call [phone].  If you test positive for Covid 19 in the 2 weeks post procedure, please call and report this information to [name].    If any biopsies were taken you will be contacted by phone or by letter within the next 1-3 weeks.  Please call us at [phone] if you have not heard  about the biopsies in 3 weeks.    SIGNATURES/CONFIDENTIALITY: You and/or your care partner have signed paperwork which will be entered into your electronic medical record.  These signatures attest to the fact that that the information above on your After Visit Summary has been reviewed and is understood.  Full responsibility of the confidentiality of this discharge information lies with you and/or your care-partner.

## 2020-09-29 NOTE — Progress Notes (Signed)
VS by [place]  Pt's states no medical or surgical changes since previsit or office visit.  

## 2020-10-03 ENCOUNTER — Telehealth: Payer: Self-pay

## 2020-10-03 NOTE — Telephone Encounter (Signed)
  Follow up Call-  Call back number 09/29/2020  Post procedure Call Back phone  # [phone]  Permission to leave phone message Yes  Some recent data might be hidden     Patient questions:  Do you have a fever, pain , or abdominal swelling? No. Pain Score  0 *  Have you tolerated food without any problems? Yes.    Have you been able to return to your normal activities? Yes.    Do you have any questions about your discharge instructions: Diet   No. Medications  No. Follow up visit  No.  Do you have questions or concerns about your Care? No.  Actions: * If pain score is 4 or above: No action needed, pain <4.

## 2020-10-16 ENCOUNTER — Encounter: Payer: Self-pay | Admitting: Internal Medicine

## 2021-02-09 DIAGNOSIS — Z8742 Personal history of other diseases of the female genital tract: Secondary | ICD-10-CM | POA: Diagnosis not present

## 2021-02-09 DIAGNOSIS — Z131 Encounter for screening for diabetes mellitus: Secondary | ICD-10-CM | POA: Diagnosis not present

## 2021-02-09 DIAGNOSIS — M5136 Other intervertebral disc degeneration, lumbar region: Secondary | ICD-10-CM | POA: Diagnosis not present

## 2021-02-09 DIAGNOSIS — Z0000 Encounter for general adult medical examination without abnormal findings: Secondary | ICD-10-CM | POA: Diagnosis not present

## 2021-02-09 DIAGNOSIS — R69 Illness, unspecified: Secondary | ICD-10-CM | POA: Diagnosis not present

## 2021-02-09 DIAGNOSIS — Z130 Encounter for screening for diseases of the blood and blood-forming organs and certain disorders involving the immune mechanism: Secondary | ICD-10-CM | POA: Diagnosis not present

## 2022-02-13 ENCOUNTER — Telehealth: Payer: Self-pay | Admitting: *Deleted

## 2022-02-13 NOTE — Telephone Encounter (Signed)
Call returned to patient.  Left detailed message, ok per dpr. Advised per Dr. Quincy Simmonds. Return call to office at [phone] to schedule OV or if any questions.   Encounter closed.

## 2022-02-13 NOTE — Telephone Encounter (Signed)
Patient left detailed message requesting return call regarding PAP smears. Patient states PCP requested that she inquire with GYN if PAP smears still needed, will be 72 in 04/2022.    Last AEX 09/14/18 with Melvia Heaps, NP. PAP 06/19/17 Neg, neg HPV  Dr. Quincy Simmonds -please review and advise.

## 2022-02-13 NOTE — Telephone Encounter (Signed)
I would recommend she have an office visit for determination of her gynecologic health needs.   After she is seen, we can make then recommendations for her care.

## 2022-03-21 NOTE — Progress Notes (Signed)
71 y.o. [id number] Single Caucasian female here for NEW GYN/annual exam.    Not sexually active for years.   Frequent urination and up at night.  Can leak with sneeze.  Wears panty liners.  Tries to do Kegel's. Some fecal urgency and accidental leakage of stool.  Teaches 9 yoga classes per week.   PCP:   [place], [place]  Patient's last menstrual period was 03/25/2001.           Sexually active: No.  The current method of family planning is abstinence.    Exercising: Yes.    Teaches 9 yoga classes a week Smoker:  former  Health Maintenance: Pap:  06/19/17 negative, 05/26/15 negative History of abnormal Pap:  yes, years ago MMG:  05/2021 normal per pt. 09/23/18 Breast Density Category B, BI-RADS CATEGORY 0 incomplete Colonoscopy:  09/29/20 - normal.  Hx hemorrhoids.  BMD:   Declines.  States she would not treat osteoporosis.  TDaP:  07/22/17 Gardasil:   no HIV: no Hep C: no Screening Labs:  PCP Flu, Covid, RSV vaccines completed.    reports that she has quit smoking. She has never used smokeless tobacco. She reports current alcohol use of about 10.0 standard drinks of alcohol per week. She reports that she does not use drugs.  Past Medical History:  Diagnosis Date   Abnormal Pap smear of cervix    over 15 yrs ago   Allergy    seasonal allergies   Anxiety    hx of   Cataract    bilateral sx   DDD (degenerative disc disease)    Diverticulosis    Hemorrhoids    Hx of adenomatous and sessile serratyedcolonic polyps 09/29/2020   max 10 mm - 3 total   Inguinal hernia    Lumbar herniated disc    Vitamin D deficiency     Past Surgical History:  Procedure Laterality Date   BLEPHAROPLASTY  2000   BREAST ENHANCEMENT SURGERY     x 2   BUNIONECTOMY Right    COLONOSCOPY     CRYOTHERAPY     over 15 yrs ago   HEMORRHOID SURGERY     INGUINAL HERNIA REPAIR Left    TONSILLECTOMY     WISDOM TOOTH EXTRACTION      Current Outpatient Medications  Medication Sig  Dispense Refill   BIOTIN PO Take 10,000 mcg by mouth daily.     Cats Claw, Uncaria Tomentosa, (CATS CLAW PO) Take by mouth.     Cholecalciferol (VITAMIN D PO) Take by mouth every other day.     Cyanocobalamin (B-12) 2000 MCG TABS Take 1 tablet by mouth daily.     escitalopram (LEXAPRO) 10 MG tablet Take 10 mg by mouth daily.     Multiple Vitamin (MULTIVITAMIN PO) Take 1 tablet by mouth daily at 6 (six) AM.     Omega-3 Fatty Acids (FISH OIL) 1000 MG CAPS daily at 6 (six) AM.     TURMERIC PO Take by mouth.     UNABLE TO FIND Take 1 tablet by mouth daily. Serene (serotonin support)     No current facility-administered medications for this visit.    Family History  Problem Relation Age of Onset   Leukemia Father    Hypertension Mother    COPD Mother    Colon cancer Neg Hx    Colon polyps Neg Hx    Esophageal cancer Neg Hx    Stomach cancer Neg Hx    Rectal cancer Neg  Hx     Review of Systems  All other systems reviewed and are negative.   Exam:   BP 124/82 (BP Location: Left Arm, Patient Position: Sitting, Cuff Size: Normal)   Ht '5\' 4"'$  (1.626 m)   Wt 124 lb (56.2 kg)   LMP 03/25/2001   BMI 21.28 kg/m     General appearance: alert, cooperative and appears stated age Head: normocephalic, without obvious abnormality, atraumatic Neck: no adenopathy, supple, symmetrical, trachea midline and thyroid normal to inspection and palpation Lungs: clear to auscultation bilaterally Breasts: normal appearance, bilateral implants, no masses or tenderness, No nipple retraction or dimpling, No nipple discharge or bleeding, No axillary adenopathy Heart: regular rate and rhythm Abdomen: left lower abdomen with subcutaneous firm nodule likely consistent with permanent suture.  Abdomen is soft, non-tender; no masses, no organomegaly Extremities: extremities normal, atraumatic, no cyanosis or edema Skin: skin color, texture, turgor normal. No rashes or lesions Lymph nodes: cervical,  supraclavicular, and axillary nodes normal. Neurologic: grossly normal  Pelvic: External genitalia:  no lesions              No abnormal inguinal nodes palpated.              Urethra:  normal appearing urethra with no masses, tenderness or lesions              Bartholins and Skenes: normal                 Vagina: normal appearing vagina with normal color and discharge, no lesions              Cervix: no lesions              Pap taken: yes Bimanual Exam:  Uterus:  normal size, contour, position, consistency, mobility, non-tender              Adnexa: no mass, fullness, tenderness              Rectal exam: yes.  Confirms.              Anus:  normal sphincter tone, no lesions  Chaperone was present for exam:  Emily  Assessment:   Well woman visit with gynecologic exam. Bilateral breast enhancement.  Stress urinary incontinence.  Fecal incontinence.    Plan: Mammogram screening discussed.  Will get a copy of her mammogram from 2023.   She will have [facility] send a copy from 2024 after she has it done.  Self breast awareness reviewed. Pap and HR HPV collected. Guidelines for Calcium, Vitamin D, regular exercise program including cardiovascular and weight bearing exercise. Try Metamucil for fecal incontinence.  Referral for pelvic floor therapy with [facility].  Breast/pelvic/pap in 2 years and FU prn.  After visit summary provided.   20 min  total time was spent for this patient encounter, including preparation, face-to-face counseling with the patient, coordination of care, and documentation of the encounter in addition to doing breast and pelvic exam and pap.

## 2022-04-03 ENCOUNTER — Encounter: Payer: Self-pay | Admitting: Obstetrics and Gynecology

## 2022-04-03 ENCOUNTER — Ambulatory Visit (INDEPENDENT_AMBULATORY_CARE_PROVIDER_SITE_OTHER): Payer: Medicare HMO | Admitting: Obstetrics and Gynecology

## 2022-04-03 ENCOUNTER — Telehealth: Payer: Self-pay | Admitting: Obstetrics and Gynecology

## 2022-04-03 ENCOUNTER — Other Ambulatory Visit (HOSPITAL_COMMUNITY)
Admission: RE | Admit: 2022-04-03 | Discharge: 2022-04-03 | Disposition: A | Discharge status: Home / Self Care | Payer: Medicare HMO | Source: Ambulatory Visit | Attending: Obstetrics and Gynecology | Admitting: Obstetrics and Gynecology

## 2022-04-03 VITALS — BP 124/82 | Ht 64.0 in | Wt 124.0 lb

## 2022-04-03 DIAGNOSIS — Z01419 Encounter for gynecological examination (general) (routine) without abnormal findings: Secondary | ICD-10-CM | POA: Diagnosis not present

## 2022-04-03 DIAGNOSIS — Z124 Encounter for screening for malignant neoplasm of cervix: Secondary | ICD-10-CM | POA: Insufficient documentation

## 2022-04-03 DIAGNOSIS — R151 Fecal smearing: Secondary | ICD-10-CM

## 2022-04-03 DIAGNOSIS — N393 Stress incontinence (female) (male): Secondary | ICD-10-CM

## 2022-04-03 DIAGNOSIS — R159 Full incontinence of feces: Secondary | ICD-10-CM

## 2022-04-03 NOTE — Telephone Encounter (Signed)
Please make a referral for pelvic floor therapy with [facility] for my patient.  She has stress incontinence and fecal incontinence.

## 2022-04-03 NOTE — Telephone Encounter (Signed)
Referrral order placed in Epic.

## 2022-04-03 NOTE — Patient Instructions (Signed)

## 2022-04-08 LAB — CYTOLOGY - PAP
Comment: NEGATIVE
Diagnosis: NEGATIVE
High risk HPV: NEGATIVE

## 2022-04-09 ENCOUNTER — Encounter: Payer: Self-pay | Admitting: Obstetrics and Gynecology

## 2022-04-10 NOTE — Telephone Encounter (Signed)
Patient is scheduled for 04/17/2022.

## 2022-04-16 NOTE — Therapy (Unsigned)
OUTPATIENT PHYSICAL THERAPY FEMALE PELVIC EVALUATION   Patient Name: Tammy Porter MRN: [id number] DOB:Jul 20, 1950, 72 y.o., female Today's Date: 04/17/2022  END OF SESSION:  PT End of Session - 04/17/22 0900     Visit Number 1    Date for PT Re-Evaluation 07/10/22    Authorization Type [employer]    Authorization - Visit Number 1    Authorization - Number of Visits 10    PT Start Time 0800    PT Stop Time 0900    PT Time Calculation (min) 60 min    Activity Tolerance Patient tolerated treatment well    Behavior During Therapy WFL for tasks assessed/performed             Past Medical History:  Diagnosis Date   Abnormal Pap smear of cervix    over 15 yrs ago   Allergy    seasonal allergies   Anxiety    hx of   Cataract    bilateral sx   DDD (degenerative disc disease)    Diverticulosis    Hemorrhoids    Hx of adenomatous and sessile serratyedcolonic polyps 09/29/2020   max 10 mm - 3 total   Inguinal hernia    Lumbar herniated disc    Vitamin D deficiency    Past Surgical History:  Procedure Laterality Date   BLEPHAROPLASTY  2000   BREAST ENHANCEMENT SURGERY     x 2   BUNIONECTOMY Right    COLONOSCOPY     CRYOTHERAPY     over 15 yrs ago   Blanco Left    TONSILLECTOMY     WISDOM TOOTH EXTRACTION     Patient Active Problem List   Diagnosis Date Noted   Herniation of right side of L4-L5 intervertebral disc 12/23/2017   Bleeding internal hemorrhoids 07/25/2015   Loose stools 07/25/2015   EXTERNAL HEMORRHOIDS 06/14/2008    PCP: [place], [place]  REFERRING PROVIDER: Nunzio Cobbs, MD   REFERRING DIAG:  N39.3 (ICD-10-CM) - Stress incontinence  R15.9 (ICD-10-CM) - Incontinence of feces, unspecified fecal incontinence type    THERAPY DIAG:  Muscle weakness (generalized)  Other lack of coordination  Fecal urgency  Rationale for Evaluation and Treatment: Rehabilitation  ONSET DATE:  2014  SUBJECTIVE:                                                                                                                                                                                           SUBJECTIVE STATEMENT: Symptoms have become progressively worse.  Fluid intake: Yes: green tea, water, carbonated  water    PAIN:  Are you having pain? No   PRECAUTIONS: None  WEIGHT BEARING RESTRICTIONS: No  FALLS:  Has patient fallen in last 6 months? No  LIVING ENVIRONMENT: Lives with: lives alone  OCCUPATION: teaches 9 yoga classes per week  PLOF: Independent  PATIENT GOALS: reduce the leakage and work the pelvic floor correctly  PERTINENT HISTORY:  Diverticulosis; Hemorrhoids, Inguinal hernia repair left   BOWEL MOVEMENT: Pain with bowel movement: No Type of bowel movement:Type (Bristol Stool Scale) type 5, Frequency when urinates, and Strain No Fully empty rectum: No, always has some stool when she wipes Leakage: Yes: comes out when urinating, needs to get to the bathroom in morning or it can leak Fiber supplement: Yes: Metamucil  URINATION: Pain with urination: No Fully empty bladder: Yes:   Stream: Strong Urgency: No Frequency: every 2-3 hours Leakage: Coughing and Sneezing in standing Pads: Yes: to keep fresh  INTERCOURSE: Not active   PREGNANCY: Vaginal deliveries 1 Tearing Yes: episiotomy   PROLAPSE: None   OBJECTIVE:   DIAGNOSTIC FINDINGS:  none   COGNITION: Overall cognitive status: Within functional limits for tasks assessed     SENSATION: Light touch: Appears intact Proprioception: Appears intact   POSTURE: No Significant postural limitations  PELVIC ALIGNMENT: Correct alignment  LUMBARAROM/PROM: lumbar ROM is full    LOWER EXTREMITY ROM: Bilateral hip ROM is full   LOWER EXTREMITY MMT:  MMT Right eval Left eval  Hip abduction 3+/5 3+/5   PALPATION:   General  right diaphragm is tighter,                  External Perineal Exam good coloring, clitoral hood is enveloping the clitoris, tightness along the perineal body                             Internal Pelvic Floor tightness along the posterior vaginal wall and along the introitus; rectally the puborectalis does not move, decreased mobility of the internal anal sphincter and tightness along the anterior rectum  Patient confirms identification and approves PT to assess internal pelvic floor and treatment Yes  PELVIC MMT:   MMT eval  Vaginal 3/5 holding 10 sec supine; standing 2/5 with contraction of the gluteal  Internal Anal Sphincter Initially 1/5 then with manual work increased to 4/5 holding 5 sec  External Anal Sphincter Initially 2/5 then with manual work increased to 4/5 holding 5 sec  Puborectalis Initially 0/5 then with manual work increased to 4/5 holding 5 sec         TONE: Increased in the first layer of the pelvic floor muscles  PROLAPSE: none  TODAY'S TREATMENT:                                                                                                                              DATE: 04/17/22  EVAL See below   PATIENT EDUCATION:  04/17/22 Education details: Access Code: [email or web address] Person educated: Patient Education method: Explanation, Demonstration, Tactile cues, Verbal cues, and Handouts Education comprehension: verbalized understanding, returned demonstration, verbal cues required, tactile cues required, and needs further education  HOME EXERCISE PROGRAM: 04/17/22 Access Code: [email or web address] URL: [email or web address] Date: 04/17/2022 Prepared by: Earlie Counts  Exercises - Supine Diaphragmatic Breathing  - 1 x daily - 7 x weekly - 3 sets - 10 reps - Standing Diaphragmatic Breathing  - 1 x daily - 7 x weekly - 3 sets - 10 reps - Standing Pelvic Floor Contraction  - 2 x daily - 7 x weekly - 1 sets - 10 reps - 8 sec hold - Supine Pelvic Floor Contraction  - 2 x daily - 7 x weekly - 1 sets - 5 reps -  10 sec hold  ASSESSMENT:  CLINICAL IMPRESSION: Patient is a 72 y.o. female  who was seen today for physical therapy evaluation and treatment for stress and fecal incontinence.  Patient reports her incontinence has started 10 years ago and has become progressively worse. She will leak urine when she coughs and sneezes in standing. She will leak stool when she urinated and first thing in the morning when she has the urge to have a bowel movement. She has decreased mobility of the perineal body, posterior wall of the vagina, along the introitus, anterior wall of the rectum and puborectalis. The puborectalis does not come forward with contraction but after manual work it was able to. Patient would contract her gluteal with pelvic floor contraction and how to learn how to isolate. Vaginal strength was 3/5 in supine and 2/5 in standing. Anal strength was 1/5 and increased to 3/5 after manual work. Patient will benefit from skilled therapy to improve pelvic floor coordination in different positions and while she is urinating.   OBJECTIVE IMPAIRMENTS: decreased activity tolerance, decreased coordination, decreased endurance, decreased strength, and increased fascial restrictions.   ACTIVITY LIMITATIONS: standing, continence, and locomotion level  PARTICIPATION LIMITATIONS: community activity  PERSONAL FACTORS: Age and 1-2 comorbidities: Diverticulosis; Hemorrhoids, Inguinal hernia repair left  are also affecting patient's functional outcome.   REHAB POTENTIAL: Excellent  CLINICAL DECISION MAKING: Stable/uncomplicated  EVALUATION COMPLEXITY: Low   GOALS: Goals reviewed with patient? Yes  SHORT TERM GOALS: Target date: 05/15/22  Patient is able to isolate her vaginal and anal contraction without contracting her gluteals.  Baseline: Goal status: INITIAL  2.  Patient has improved pelvic floor mobility to have a full pelvic floor contraction with a lift.  Baseline:  Goal status: INITIAL   LONG  TERM GOALS: Target date: 07/10/22  Patient is independent with advanced pelvic floor strength and elongation.  Baseline:  Goal status: INITIAL  2.  Vaginal strength >/= 4/5 in standing to reduce her leakage with coughing and sneezing.  Baseline:  Goal status: INITIAL  3.  Patient is able to urinate without a bowel movement due to isolation of the rectal muscles and the puborectalis is able to move.  Baseline:  Goal status: INITIAL  4.  Rectal strength >/= 4/5 holding for 40 seconds so she is able to walk to the bathroom in the morning without leaking stool.  Baseline:  Goal status: INITIAL  5.  Patient is able to fully relax and fully contract her pelvic floor to improve coordination.  Baseline:  Goal status: INITIAL   PLAN:  PT FREQUENCY: 1x/week  PT DURATION: 12 weeks  PLANNED INTERVENTIONS: Therapeutic exercises, Therapeutic activity, Neuromuscular re-education, Patient/Family education, Dry Needling, Biofeedback, and  Manual therapy  PLAN FOR NEXT SESSION: manual work to the anterior rectum and post. Vaginal, work on fully relaxing and contracting the pelvic floor, EMG to contract the anus and relax the vagina   Earlie Counts, PT 04/17/22 9:30 AM

## 2022-04-17 ENCOUNTER — Other Ambulatory Visit: Payer: Self-pay

## 2022-04-17 ENCOUNTER — Encounter: Payer: Self-pay | Admitting: Physical Therapy

## 2022-04-17 ENCOUNTER — Ambulatory Visit: Payer: Medicare HMO | Attending: Obstetrics and Gynecology | Admitting: Physical Therapy

## 2022-04-17 DIAGNOSIS — R159 Full incontinence of feces: Secondary | ICD-10-CM | POA: Diagnosis not present

## 2022-04-17 DIAGNOSIS — N393 Stress incontinence (female) (male): Secondary | ICD-10-CM | POA: Insufficient documentation

## 2022-04-17 DIAGNOSIS — R278 Other lack of coordination: Secondary | ICD-10-CM

## 2022-04-17 DIAGNOSIS — R152 Fecal urgency: Secondary | ICD-10-CM | POA: Diagnosis present

## 2022-04-17 DIAGNOSIS — M6281 Muscle weakness (generalized): Secondary | ICD-10-CM | POA: Insufficient documentation

## 2022-05-24 ENCOUNTER — Ambulatory Visit: Payer: Medicare HMO | Attending: Obstetrics and Gynecology | Admitting: Physical Therapy

## 2022-05-24 ENCOUNTER — Encounter: Payer: Self-pay | Admitting: Physical Therapy

## 2022-05-24 DIAGNOSIS — M6281 Muscle weakness (generalized): Secondary | ICD-10-CM

## 2022-05-24 DIAGNOSIS — R278 Other lack of coordination: Secondary | ICD-10-CM | POA: Insufficient documentation

## 2022-05-24 DIAGNOSIS — R152 Fecal urgency: Secondary | ICD-10-CM

## 2022-05-24 NOTE — Patient Instructions (Signed)
Urge Incontinence  Ideal urination frequency is every 2-4 wakeful hours, which equates to 5-8 times within a 24-hour period.   Urge incontinence is leakage that occurs when the bladder muscle contracts, creating a sudden need to go before getting to the bathroom.   Going too often when your bladder isn't actually full can disrupt the body's automatic signals to store and hold urine longer, which will increase urgency/frequency.  In this case, the bladder "is running the show" and strategies can be learned to retrain this pattern.   One should be able to control the first urge to urinate, at around 155m.  The bladder can hold up to a "grande latte," or 409m To help you gain control, practice the Urge Drill below when urgency strikes.  This drill will help retrain your bladder signals and allow you to store and hold urine longer.  The overall goal is to stretch out your time between voids to reach a more manageable voiding schedule.    Practice your "quick flicks" often throughout the day (each waking hour) even when you don't need feel the urge to go.  This will help strengthen your pelvic floor muscles, making them more effective in controlling leakage.  Urge Drill  When you feel an urge to go, follow these steps to regain control: Stop what you are doing and be still Take one deep breath, directing your air into your abdomen Think an affirming thought, such as "I've got this." Do 5 quick flicks of your pelvic floor Walk with control to the bathroom to void, or delay voiding  [facility]SuTotowarLone Rock[place] [id number] # [phone]

## 2022-05-24 NOTE — Therapy (Signed)
OUTPATIENT PHYSICAL THERAPY TREATMENT NOTE   Patient Name: Tammy Porter MRN: [id number] DOB:Apr 10, 1950, 72 y.o., female Today's Date: 05/24/2022  PCP: [place], [place]  REFERRING PROVIDER: Nunzio Cobbs, MD   END OF SESSION:   PT End of Session - 05/24/22 0847     Visit Number 2    Date for PT Re-Evaluation 07/10/22    Authorization Type [employer]    Authorization - Visit Number 2    Authorization - Number of Visits 10    PT Start Time 0845    PT Stop Time 0925    PT Time Calculation (min) 40 min    Activity Tolerance Patient tolerated treatment well    Behavior During Therapy [facility] for tasks assessed/performed             Past Medical History:  Diagnosis Date   Abnormal Pap smear of cervix    over 15 yrs ago   Allergy    seasonal allergies   Anxiety    hx of   Cataract    bilateral sx   DDD (degenerative disc disease)    Diverticulosis    Hemorrhoids    Hx of adenomatous and sessile serratyedcolonic polyps 09/29/2020   max 10 mm - 3 total   Inguinal hernia    Lumbar herniated disc    Vitamin D deficiency    Past Surgical History:  Procedure Laterality Date   BLEPHAROPLASTY  2000   BREAST ENHANCEMENT SURGERY     x 2   BUNIONECTOMY Right    COLONOSCOPY     CRYOTHERAPY     over 15 yrs ago   [place] Left    TONSILLECTOMY     WISDOM TOOTH EXTRACTION     Patient Active Problem List   Diagnosis Date Noted   Herniation of right side of L4-L5 intervertebral disc 12/23/2017   Bleeding internal hemorrhoids 07/25/2015   Loose stools 07/25/2015   EXTERNAL HEMORRHOIDS 06/14/2008   REFERRING DIAG:  N39.3 (ICD-10-CM) - Stress incontinence  R15.9 (ICD-10-CM) - Incontinence of feces, unspecified fecal incontinence type      THERAPY DIAG:  Muscle weakness (generalized)   Other lack of coordination   Fecal urgency   Rationale for Evaluation and Treatment: Rehabilitation   ONSET DATE: 2014    SUBJECTIVE:                                                                                                                                                                                            SUBJECTIVE STATEMENT: Last time helped. When I laugh  sneeze and cough I have not leaked.  Fluid intake: Yes: green tea, water, carbonated water     PAIN:  Are you having pain? No     PRECAUTIONS: None   WEIGHT BEARING RESTRICTIONS: No   FALLS:  Has patient fallen in last 6 months? No   LIVING ENVIRONMENT: Lives with: lives alone   OCCUPATION: teaches 9 yoga classes per week   PLOF: Independent   PATIENT GOALS: reduce the leakage and work the pelvic floor correctly   PERTINENT HISTORY:  Diverticulosis; Hemorrhoids, Inguinal hernia repair left     BOWEL MOVEMENT: Pain with bowel movement: No Type of bowel movement:Type (Bristol Stool Scale) type 5, Frequency when urinates, and Strain No Fully empty rectum: No, always has some stool when she wipes Leakage: Yes: comes out when urinating, needs to get to the bathroom in morning or it can leak Fiber supplement: Yes: Metamucil   URINATION: Pain with urination: No Fully empty bladder: Yes:   Stream: Strong Urgency: No Frequency: every 2-3 hours Leakage: Coughing and Sneezing in standing Pads: Yes: to keep fresh   INTERCOURSE: Not active     PREGNANCY: Vaginal deliveries 1 Tearing Yes: episiotomy     PROLAPSE: None     OBJECTIVE:    DIAGNOSTIC FINDINGS:  none     COGNITION: Overall cognitive status: Within functional limits for tasks assessed                          SENSATION: Light touch: Appears intact Proprioception: Appears intact     POSTURE: No Significant postural limitations   PELVIC ALIGNMENT: Correct alignment   LUMBARAROM/PROM: lumbar ROM is full       LOWER EXTREMITY ROM: Bilateral hip ROM is full     LOWER EXTREMITY MMT:   MMT Right eval Left eval  Hip abduction 3+/5 3+/5     PALPATION:   General  right diaphragm is tighter,                  External Perineal Exam good coloring, clitoral hood is enveloping the clitoris, tightness along the perineal body                             Internal Pelvic Floor tightness along the posterior vaginal wall and along the introitus; rectally the puborectalis does not move, decreased mobility of the internal anal sphincter and tightness along the anterior rectum   Patient confirms identification and approves PT to assess internal pelvic floor and treatment Yes   PELVIC MMT:   MMT eval  05/24/22  Vaginal 3/5 holding 10 sec supine; standing 2/5 with contraction of the gluteal   Internal Anal Sphincter Initially 1/5 then with manual work increased to 4/5 holding 5 sec 4/5  External Anal Sphincter Initially 2/5 then with manual work increased to 4/5 holding 5 sec 4/5  Puborectalis Initially 0/5 then with manual work increased to 4/5 holding 5 sec 4/5          TONE: Increased in the first layer of the pelvic floor muscles   PROLAPSE: none   TODAY'S TREATMENT:   05/24/22 Manual: Internal pelvic floor techniques: No emotional/communication barriers or cognitive limitation. Patient is motivated to learn. Patient understands and agrees with treatment goals and plan. PT explains patient will be examined in standing, sitting, and lying down to see how their muscles and joints work. When they are ready, they  will be asked to remove their underwear so PT can examine their perineum. The patient is also given the option of providing their own chaperone as one is not provided in our facility. The patient also has the right and is explained the right to defer or refuse any part of the evaluation or treatment including the internal exam. With the patient's consent, PT will use one gloved finger to gently assess the muscles of the pelvic floor, seeing how well it contracts and relaxes and if there is muscle symmetry. After, the patient will get  dressed and PT and patient will discuss exam findings and plan of care. PT and patient discuss plan of care, schedule, attendance policy and HEP activities.  Going through the rectum working on the anterior portion of the internal anal sphincter to reduce the thickness Neuromuscular re-education: Pelvic floor contraction training: Therapist finger in the rectum working on holding for 10 to 15 sec then quick contractions Dead bug with leg extension with pelvic floor contraction Down training: Educated patient on urge to void for fecal urgency and she was able to demonstrate                                                                                                                             DATE: 04/17/22  EVAL See below     PATIENT EDUCATION:  05/24/22 Education details: Access Code: [email or web address],urge to void Person educated: Patient Education method: Consulting civil engineer, Demonstration, Corporate treasurer cues, Verbal cues, and Handouts Education comprehension: verbalized understanding, returned demonstration, verbal cues required, tactile cues required, and needs further education   HOME EXERCISE PROGRAM: 05/24/22 Access Code: [email or web address] URL: [email or web address] Date: 05/24/2022 Prepared by: Earlie Counts  Exercises - Supine Diaphragmatic Breathing  - 1 x daily - 7 x weekly - 3 sets - 10 reps - Standing Diaphragmatic Breathing  - 1 x daily - 7 x weekly - 3 sets - 10 reps - Standing Pelvic Floor Contraction  - 3 x daily - 7 x weekly - 1 sets - 10 reps - 10-15 sec hold - Supine Pelvic Floor Contraction  - 2 x daily - 7 x weekly - 1 sets - 5 reps - 10 sec hold - Seated Quick Flick Pelvic Floor Contractions  - 3 x daily - 7 x weekly - 1 sets - 10 reps   ASSESSMENT:   CLINICAL IMPRESSION: Patient is a 72 y.o. female  who was seen today for physical therapy  treatment for stress and fecal incontinence.  Patient has not had urinary leakage since last visit. She has less fecal leakage since last  visit. Anal strength is 4/5 holding for 5 seconds. She is able to perform quick contractions. She had some thickness located in the anterior internal anal sphincter.  Patient will benefit from skilled therapy to improve pelvic floor coordination in different positions and while she is urinating.    OBJECTIVE IMPAIRMENTS: decreased activity tolerance, decreased coordination, decreased endurance, decreased strength, and increased  fascial restrictions.    ACTIVITY LIMITATIONS: standing, continence, and locomotion level   PARTICIPATION LIMITATIONS: community activity   PERSONAL FACTORS: Age and 1-2 comorbidities: Diverticulosis; Hemorrhoids, Inguinal hernia repair left  are also affecting patient's functional outcome.    REHAB POTENTIAL: Excellent   CLINICAL DECISION MAKING: Stable/uncomplicated   EVALUATION COMPLEXITY: Low     GOALS: Goals reviewed with patient? Yes   SHORT TERM GOALS: Target date: 05/15/22   Patient is able to isolate her vaginal and anal contraction without contracting her gluteals.  Baseline: Goal status: Met 05/24/22   2.  Patient has improved pelvic floor mobility to have a full pelvic floor contraction with a lift.  Baseline:  Goal status: Met 05/24/22     LONG TERM GOALS: Target date: 07/10/22   Patient is independent with advanced pelvic floor strength and elongation.  Baseline:  Goal status: INITIAL   2.  Vaginal strength >/= 4/5 in standing to reduce her leakage with coughing and sneezing.  Baseline:  Goal status: INITIAL   3.  Patient is able to urinate without a bowel movement due to isolation of the rectal muscles and the puborectalis is able to move.  Baseline:  Goal status: INITIAL   4.  Rectal strength >/= 4/5 holding for 40 seconds so she is able to walk to the bathroom in the morning without leaking stool.  Baseline:  Goal status: INITIAL   5.  Patient is able to fully relax and fully contract her pelvic floor to improve coordination.   Baseline:  Goal status: INITIAL     PLAN:   PT FREQUENCY: 1x/week   PT DURATION: 12 weeks   PLANNED INTERVENTIONS: Therapeutic exercises, Therapeutic activity, Neuromuscular re-education, Patient/Family education, Dry Needling, Biofeedback, and Manual therapy   PLAN FOR NEXT SESSION:  EMG to contract the anus and relax the vagina  Earlie Counts, PT 05/24/22 9:28 AM

## 2022-06-07 ENCOUNTER — Ambulatory Visit: Payer: Medicare HMO | Admitting: Physical Therapy

## 2022-06-07 ENCOUNTER — Encounter: Payer: Self-pay | Admitting: Physical Therapy

## 2022-06-07 DIAGNOSIS — M6281 Muscle weakness (generalized): Secondary | ICD-10-CM | POA: Diagnosis not present

## 2022-06-07 DIAGNOSIS — R278 Other lack of coordination: Secondary | ICD-10-CM

## 2022-06-07 DIAGNOSIS — R152 Fecal urgency: Secondary | ICD-10-CM

## 2022-06-07 NOTE — Therapy (Signed)
OUTPATIENT PHYSICAL THERAPY TREATMENT NOTE   Patient Name: Tammy Porter MRN: [id number] DOB:1950/12/25, 72 y.o., female Today's Date: 06/07/2022  PCP: [place], [place]  REFERRING PROVIDER: Nunzio Cobbs, MD   END OF SESSION:   PT End of Session - 06/07/22 0850     Visit Number 3    Date for PT Re-Evaluation 07/10/22    Authorization Type [employer] Medicare    Authorization - Visit Number 3    Authorization - Number of Visits 10    PT Start Time 0845    PT Stop Time 0925    PT Time Calculation (min) 40 min    Activity Tolerance Patient tolerated treatment well    Behavior During Therapy WFL for tasks assessed/performed             Past Medical History:  Diagnosis Date   Abnormal Pap smear of cervix    over 15 yrs ago   Allergy    seasonal allergies   Anxiety    hx of   Cataract    bilateral sx   DDD (degenerative disc disease)    Diverticulosis    Hemorrhoids    Hx of adenomatous and sessile serratyedcolonic polyps 09/29/2020   max 10 mm - 3 total   Inguinal hernia    Lumbar herniated disc    Vitamin D deficiency    Past Surgical History:  Procedure Laterality Date   BLEPHAROPLASTY  2000   BREAST ENHANCEMENT SURGERY     x 2   BUNIONECTOMY Right    COLONOSCOPY     CRYOTHERAPY     over 15 yrs ago   [place] Left    TONSILLECTOMY     WISDOM TOOTH EXTRACTION     Patient Active Problem List   Diagnosis Date Noted   Herniation of right side of L4-L5 intervertebral disc 12/23/2017   Bleeding internal hemorrhoids 07/25/2015   Loose stools 07/25/2015   EXTERNAL HEMORRHOIDS 06/14/2008   REFERRING DIAG:  N39.3 (ICD-10-CM) - Stress incontinence  R15.9 (ICD-10-CM) - Incontinence of feces, unspecified fecal incontinence type      THERAPY DIAG:  Muscle weakness (generalized)   Other lack of coordination   Fecal urgency   Rationale for Evaluation and Treatment: Rehabilitation   ONSET DATE:  2014   SUBJECTIVE:                                                                                                                                                                                            SUBJECTIVE STATEMENT: I have less urinary leakage. The  fecal leakage in the morning I can hold the stool. The stool is has more consistency. Having consistent bowel movements.  Fluid intake: Yes: green tea, water, carbonated water     PAIN:  Are you having pain? No     PRECAUTIONS: None   WEIGHT BEARING RESTRICTIONS: No   FALLS:  Has patient fallen in last 6 months? No   LIVING ENVIRONMENT: Lives with: lives alone   OCCUPATION: teaches 9 yoga classes per week   PLOF: Independent   PATIENT GOALS: reduce the leakage and work the pelvic floor correctly   PERTINENT HISTORY:  Diverticulosis; Hemorrhoids, Inguinal hernia repair left     BOWEL MOVEMENT: Pain with bowel movement: No Type of bowel movement:Type (Bristol Stool Scale) type 5, Frequency when urinates, and Strain No Fully empty rectum: No, always has some stool when she wipes Leakage: Yes: comes out when urinating, needs to get to the bathroom in morning or it can leak Fiber supplement: Yes: Metamucil   URINATION: Pain with urination: No Fully empty bladder: Yes:   Stream: Strong Urgency: No Frequency: every 2-3 hours Leakage: Coughing and Sneezing in standing Pads: Yes: to keep fresh   INTERCOURSE: Not active     PREGNANCY: Vaginal deliveries 1 Tearing Yes: episiotomy     PROLAPSE: None     OBJECTIVE:    DIAGNOSTIC FINDINGS:  none     COGNITION: Overall cognitive status: Within functional limits for tasks assessed                          SENSATION: Light touch: Appears intact Proprioception: Appears intact     POSTURE: No Significant postural limitations   PELVIC ALIGNMENT: Correct alignment   LUMBARAROM/PROM: lumbar ROM is full       LOWER EXTREMITY ROM: Bilateral hip ROM is  full     LOWER EXTREMITY MMT:   MMT Right eval Left eval  Hip abduction 3+/5 3+/5    PALPATION:   General  right diaphragm is tighter,                  External Perineal Exam good coloring, clitoral hood is enveloping the clitoris, tightness along the perineal body                             Internal Pelvic Floor tightness along the posterior vaginal wall and along the introitus; rectally the puborectalis does not move, decreased mobility of the internal anal sphincter and tightness along the anterior rectum   Patient confirms identification and approves PT to assess internal pelvic floor and treatment Yes   PELVIC MMT:   MMT eval   05/24/22  Vaginal 3/5 holding 10 sec supine; standing 2/5 with contraction of the gluteal    Internal Anal Sphincter Initially 1/5 then with manual work increased to 4/5 holding 5 sec 4/5  External Anal Sphincter Initially 2/5 then with manual work increased to 4/5 holding 5 sec 4/5  Puborectalis Initially 0/5 then with manual work increased to 4/5 holding 5 sec 4/5          TONE: Increased in the first layer of the pelvic floor muscles   PROLAPSE: none   TODAY'S TREATMENT:  06/07/22 Neuromuscular re-education: Pelvic floor contraction training: Quick contractions to 40 uv and up and down very good in sitting a standing Sitting contraction of anus for 5 seconds above 20 uv but fatigues as she contracts.  Standing contract the anus for 5 seconds above the 20 uv at 50%  Standing and marching with pelvic floor contraction Standing and hip rotation with pelvic floor contraction Standing coughing with pelvic floor contraction     05/24/22 Manual: Internal pelvic floor techniques: No emotional/communication barriers or cognitive limitation. Patient is motivated to learn. Patient understands and agrees with treatment goals and plan. PT explains patient will be examined in standing, sitting, and lying down to see how their muscles and joints work.  When they are ready, they will be asked to remove their underwear so PT can examine their perineum. The patient is also given the option of providing their own chaperone as one is not provided in our facility. The patient also has the right and is explained the right to defer or refuse any part of the evaluation or treatment including the internal exam. With the patient's consent, PT will use one gloved finger to gently assess the muscles of the pelvic floor, seeing how well it contracts and relaxes and if there is muscle symmetry. After, the patient will get dressed and PT and patient will discuss exam findings and plan of care. PT and patient discuss plan of care, schedule, attendance policy and HEP activities.  Going through the rectum working on the anterior portion of the internal anal sphincter to reduce the thickness Neuromuscular re-education: Pelvic floor contraction training: Therapist finger in the rectum working on holding for 10 to 15 sec then quick contractions Dead bug with leg extension with pelvic floor contraction Down training: Educated patient on urge to void for fecal urgency and she was able to demonstrate                                                                                                                                  PATIENT EDUCATION:  06/07/22 Education details: Access Code: [email or web address],urge to void Person educated: Patient Education method: Explanation, Demonstration, Tactile cues, Verbal cues, and Handouts Education comprehension: verbalized understanding, returned demonstration, verbal cues required, tactile cues required, and needs further education   HOME EXERCISE PROGRAM: 06/07/22 Access Code: [email or web address] URL: [email or web address] Date: 06/07/2022 Prepared by: Earlie Counts  Program Notes walk 4 steps  stop and contract then walk another 4 steps when going to the bathroom in the morning. contract the pelvic floor when coughing and  sneezing. standing hip rotation outward 10x each side with pelvic floor contraction.   Exercises - Supine Diaphragmatic Breathing  - 1 x daily - 7 x weekly - 3 sets - 10 reps - Standing Diaphragmatic Breathing  - 1 x daily - 7 x weekly - 3 sets - 10 reps - Standing Pelvic Floor Contraction  - 3 x daily - 7 x weekly - 1 sets - 10 reps - 10-15 sec hold - Supine Pelvic Floor Contraction  - 2 x daily - 7 x weekly - 1 sets - 5 reps - 10 sec hold - Seated  Quick Flick Pelvic Floor Contractions  - 3 x daily - 7 x weekly - 1 sets - 10 reps - Standing March  - 1 x daily - 3 x weekly - 1 sets - 10 reps - Standing Hip Controlled Articular Rotations  - 1 x daily - 7 x weekly - 3 sets - 10 reps   ASSESSMENT:   CLINICAL IMPRESSION: Patient is a 72 y.o. female  who was seen today for physical therapy  treatment for stress and fecal incontinence.  Patient reports less stool leakage with walking to the bathroom in the morning. She is having less urinary leakage. Patient has difficulty with contraction for 5 seconds due to the anal muscle slowly fatiguing. Patient is able to contract her pelvic floor better when she engages her lower abdominals. She is now aware of contracting her gluteals with pelvic floor contraction in standing. Her stool consistency is thicker. Patient will benefit from skilled therapy to improve pelvic floor coordination in different positions and while she is urinating.    OBJECTIVE IMPAIRMENTS: decreased activity tolerance, decreased coordination, decreased endurance, decreased strength, and increased fascial restrictions.    ACTIVITY LIMITATIONS: standing, continence, and locomotion level   PARTICIPATION LIMITATIONS: community activity   PERSONAL FACTORS: Age and 1-2 comorbidities: Diverticulosis; Hemorrhoids, Inguinal hernia repair left  are also affecting patient's functional outcome.    REHAB POTENTIAL: Excellent   CLINICAL DECISION MAKING: Stable/uncomplicated   EVALUATION  COMPLEXITY: Low     GOALS: Goals reviewed with patient? Yes   SHORT TERM GOALS: Target date: 05/15/22   Patient is able to isolate her vaginal and anal contraction without contracting her gluteals.  Baseline: Goal status: Met 05/24/22   2.  Patient has improved pelvic floor mobility to have a full pelvic floor contraction with a lift.  Baseline:  Goal status: Met 05/24/22     LONG TERM GOALS: Target date: 07/10/22   Patient is independent with advanced pelvic floor strength and elongation.  Baseline:  Goal status: INITIAL   2.  Vaginal strength >/= 4/5 in standing to reduce her leakage with coughing and sneezing.  Baseline:  Goal status: INITIAL   3.  Patient is able to urinate without a bowel movement due to isolation of the rectal muscles and the puborectalis is able to move.  Baseline: does not always have a bowel movement with urination Goal status: ongoing 06/07/22   4.  Rectal strength >/= 4/5 holding for 40 seconds so she is able to walk to the bathroom in the morning without leaking stool.  Baseline:  Goal status: INITIAL   5.  Patient is able to fully relax and fully contract her pelvic floor to improve coordination.  Baseline:  Goal status: INITIAL     PLAN:   PT FREQUENCY: 1x/week   PT DURATION: 12 weeks   PLANNED INTERVENTIONS: Therapeutic exercises, Therapeutic activity, Neuromuscular re-education, Patient/Family education, Dry Needling, Biofeedback, and Manual therapy   PLAN FOR NEXT SESSION:  EMG to contract the anus with walking, coughing, sneezing, lunges, squat Earlie Counts, PT 06/07/22 9:48 AM

## 2022-06-19 ENCOUNTER — Ambulatory Visit: Payer: Medicare HMO | Admitting: Physical Therapy

## 2022-07-05 ENCOUNTER — Encounter: Payer: Self-pay | Admitting: Physical Therapy

## 2022-07-05 ENCOUNTER — Ambulatory Visit: Payer: Medicare HMO | Attending: Obstetrics and Gynecology | Admitting: Physical Therapy

## 2022-07-05 DIAGNOSIS — R278 Other lack of coordination: Secondary | ICD-10-CM

## 2022-07-05 DIAGNOSIS — R152 Fecal urgency: Secondary | ICD-10-CM | POA: Insufficient documentation

## 2022-07-05 DIAGNOSIS — M6281 Muscle weakness (generalized): Secondary | ICD-10-CM | POA: Diagnosis not present

## 2022-07-05 NOTE — Therapy (Signed)
OUTPATIENT PHYSICAL THERAPY TREATMENT NOTE   Patient Name: Tammy Porter MRN: [id number] DOB:1951/02/04, 72 y.o., female Today's Date: 07/05/2022  PCP: [facility] [place], [place]  REFERRING PROVIDER: Patton Salles, MD   END OF SESSION:   PT End of Session - 07/05/22 0859     Visit Number 4    Authorization Type [employer]    Authorization - Visit Number 4    Authorization - Number of Visits 10    PT Start Time [phone]   came late   PT Stop Time 0925    PT Time Calculation (min) 26 min    Activity Tolerance Patient tolerated treatment well    Behavior During Therapy [facility] for tasks assessed/performed             Past Medical History:  Diagnosis Date   Abnormal Pap smear of cervix    over 15 yrs ago   Allergy    seasonal allergies   Anxiety    hx of   Cataract    bilateral sx   DDD (degenerative disc disease)    Diverticulosis    Hemorrhoids    Hx of adenomatous and sessile serratyedcolonic polyps 09/29/2020   max 10 mm - 3 total   Inguinal hernia    Lumbar herniated disc    Vitamin D deficiency    Past Surgical History:  Procedure Laterality Date   BLEPHAROPLASTY  2000   BREAST ENHANCEMENT SURGERY     x 2   BUNIONECTOMY Right    COLONOSCOPY     CRYOTHERAPY     over 15 yrs ago   HEMORRHOID SURGERY     INGUINAL HERNIA REPAIR Left    TONSILLECTOMY     WISDOM TOOTH EXTRACTION     Patient Active Problem List   Diagnosis Date Noted   Herniation of right side of L4-L5 intervertebral disc 12/23/2017   Bleeding internal hemorrhoids 07/25/2015   Loose stools 07/25/2015   EXTERNAL HEMORRHOIDS 06/14/2008   REFERRING DIAG:  N39.3 (ICD-10-CM) - Stress incontinence  R15.9 (ICD-10-CM) - Incontinence of feces, unspecified fecal incontinence type      THERAPY DIAG:  Muscle weakness (generalized)   Other lack of coordination   Fecal urgency   Rationale for Evaluation and Treatment: Rehabilitation   ONSET DATE: 2014   SUBJECTIVE:                                                                                                                                                                                             SUBJECTIVE STATEMENT: I am doing well. I am doing my exercises.  Fluid  intake: Yes: green tea, water, carbonated water     PAIN:  Are you having pain? No     PRECAUTIONS: None   WEIGHT BEARING RESTRICTIONS: No   FALLS:  Has patient fallen in last 6 months? No   LIVING ENVIRONMENT: Lives with: lives alone   OCCUPATION: teaches 9 yoga classes per week   PLOF: Independent   PATIENT GOALS: reduce the leakage and work the pelvic floor correctly   PERTINENT HISTORY:  Diverticulosis; Hemorrhoids, Inguinal hernia repair left     BOWEL MOVEMENT: Pain with bowel movement: No Type of bowel movement:Type (Bristol Stool Scale) type 5, Frequency when urinates, and Strain No Fully empty rectum: No, always has some stool when she wipes Leakage: Yes: comes out when urinating, needs to get to the bathroom in morning or it can leak Fiber supplement: Yes: Metamucil   URINATION: Pain with urination: No Fully empty bladder: Yes:   Stream: Strong Urgency: No Frequency: every 2-3 hours Leakage: Coughing and Sneezing in standing Pads: Yes: to keep fresh   INTERCOURSE: Not active     PREGNANCY: Vaginal deliveries 1 Tearing Yes: episiotomy     PROLAPSE: None     OBJECTIVE:    DIAGNOSTIC FINDINGS:  none     COGNITION: Overall cognitive status: Within functional limits for tasks assessed                          SENSATION: Light touch: Appears intact Proprioception: Appears intact     POSTURE: No Significant postural limitations   PELVIC ALIGNMENT: Correct alignment   LUMBARAROM/PROM: lumbar ROM is full       LOWER EXTREMITY ROM: Bilateral hip ROM is full     LOWER EXTREMITY MMT:   MMT Right eval Left eval  Hip abduction 3+/5 3+/5    PALPATION:   General  right diaphragm is tighter,                   External Perineal Exam good coloring, clitoral hood is enveloping the clitoris, tightness along the perineal body                             Internal Pelvic Floor tightness along the posterior vaginal wall and along the introitus; rectally the puborectalis does not move, decreased mobility of the internal anal sphincter and tightness along the anterior rectum   Patient confirms identification and approves PT to assess internal pelvic floor and treatment Yes   PELVIC MMT:   MMT eval   05/24/22  Vaginal 3/5 holding 10 sec supine; standing 2/5 with contraction of the gluteal    Internal Anal Sphincter Initially 1/5 then with manual work increased to 4/5 holding 5 sec 4/5  External Anal Sphincter Initially 2/5 then with manual work increased to 4/5 holding 5 sec 4/5  Puborectalis Initially 0/5 then with manual work increased to 4/5 holding 5 sec 4/5          TONE: Increased in the first layer of the pelvic floor muscles   PROLAPSE: none   TODAY'S TREATMENT:  07/05/22 Neuromuscular re-education: Pelvic floor contraction training: Using the RUSI to have the patient contract the pelvic floor to see the bladder lift,  Contract the pelvic floor with a cough Contract the pelvic floor with a straight leg raise.  Contract the pelvic floor with right hip abduction due to right side is a little weaker than left  Down training:    06/07/22 Neuromuscular re-education: Pelvic floor contraction training: Quick contractions to 40 uv and up and down very good in sitting a standing Sitting contraction of anus for 5 seconds above 20 uv but fatigues as she contracts.  Standing contract the anus for 5 seconds above the 20 uv at 50%  Standing and marching with pelvic floor contraction Standing and hip rotation with pelvic floor contraction Standing coughing with pelvic floor contraction     05/24/22 Manual: Internal pelvic floor techniques: No emotional/communication barriers or  cognitive limitation. Patient is motivated to learn. Patient understands and agrees with treatment goals and plan. PT explains patient will be examined in standing, sitting, and lying down to see how their muscles and joints work. When they are ready, they will be asked to remove their underwear so PT can examine their perineum. The patient is also given the option of providing their own chaperone as one is not provided in our facility. The patient also has the right and is explained the right to defer or refuse any part of the evaluation or treatment including the internal exam. With the patient's consent, PT will use one gloved finger to gently assess the muscles of the pelvic floor, seeing how well it contracts and relaxes and if there is muscle symmetry. After, the patient will get dressed and PT and patient will discuss exam findings and plan of care. PT and patient discuss plan of care, schedule, attendance policy and HEP activities.  Going through the rectum working on the anterior portion of the internal anal sphincter to reduce the thickness Neuromuscular re-education: Pelvic floor contraction training: Therapist finger in the rectum working on holding for 10 to 15 sec then quick contractions Dead bug with leg extension with pelvic floor contraction Down training: Educated patient on urge to void for fecal urgency and she was able to demonstrate                                                                                                                                   PATIENT EDUCATION:  07/05/22 Education details: Access Code: [email or web address],urge to void Person educated: Patient Education method: Explanation, Demonstration, Tactile cues, Verbal cues, and Handouts Education comprehension: verbalized understanding, returned demonstration, verbal cues required, tactile cues required, and needs further education   HOME EXERCISE PROGRAM: 07/05/22 Access Code: [email or web address] URL:  [email or web address] Date: 06/07/2022 Prepared by: Eulis Foster   Program Notes walk 4 steps  stop and contract then walk another 4 steps when going to the bathroom in the morning. contract the pelvic floor when coughing and sneezing. standing hip rotation outward 10x each side with pelvic floor contraction.    Exercises - Supine Diaphragmatic Breathing  - 1 x daily - 7 x weekly - 3 sets - 10 reps - Standing Diaphragmatic Breathing  - 1 x daily - 7 x weekly - 3 sets - 10 reps - Standing Pelvic Floor  Contraction  - 3 x daily - 7 x weekly - 1 sets - 10 reps - 10-15 sec hold - Supine Pelvic Floor Contraction  - 2 x daily - 7 x weekly - 1 sets - 5 reps - 10 sec hold - Seated Quick Flick Pelvic Floor Contractions  - 3 x daily - 7 x weekly - 1 sets - 10 reps - Standing March  - 1 x daily - 3 x weekly - 1 sets - 10 reps - Standing Hip Controlled Articular Rotations  - 1 x daily - 7 x weekly - 3 sets - 10 reps   ASSESSMENT:   CLINICAL IMPRESSION: Patient is a 72 y.o. female  who was seen today for physical therapy  treatment for stress and fecal incontinence.  She is able to walk to the bathroom without leakage. She is not having fecal leakage. Pelvic floor strength is 4/5. She is able to contract the pelvic floor with a bladder lift. She is independent with her HEP. She has very little leakage with cough and sneeze. She understands how to contract the pelvic floor with a cough. She is able to urinate without stool coming out due to isolation of the pelvic floor muscles.    OBJECTIVE IMPAIRMENTS: decreased activity tolerance, decreased coordination, decreased endurance, decreased strength, and increased fascial restrictions.    ACTIVITY LIMITATIONS: standing, continence, and locomotion level   PARTICIPATION LIMITATIONS: community activity   PERSONAL FACTORS: Age and 1-2 comorbidities: Diverticulosis; Hemorrhoids, Inguinal hernia repair left  are also affecting patient's functional  outcome.    REHAB POTENTIAL: Excellent   CLINICAL DECISION MAKING: Stable/uncomplicated   EVALUATION COMPLEXITY: Low     GOALS: Goals reviewed with patient? Yes   SHORT TERM GOALS: Target date: 05/15/22   Patient is able to isolate her vaginal and anal contraction without contracting her gluteals.  Baseline: Goal status: Met 05/24/22   2.  Patient has improved pelvic floor mobility to have a full pelvic floor contraction with a lift.  Baseline:  Goal status: Met 05/24/22     LONG TERM GOALS: Target date: 07/10/22   Patient is independent with advanced pelvic floor strength and elongation.  Baseline:  Goal status: Met 07/05/22   2.  Vaginal strength >/= 4/5 in standing to reduce her leakage with coughing and sneezing.  Baseline:  Goal status: met 07/05/22   3.  Patient is able to urinate without a bowel movement due to isolation of the rectal muscles and the puborectalis is able to move.  Baseline: does not always have a bowel movement with urination Goal status: met 07/05/22   4.  Rectal strength >/= 4/5 holding for 40 seconds so she is able to walk to the bathroom in the morning without leaking stool.  Baseline:  Goal status: Met 07/05/22   5.  Patient is able to fully relax and fully contract her pelvic floor to improve coordination.  Baseline:  Goal status: Met 07/05/22     PLAN: Discharge to HEP   Eulis Foster, PT 07/05/22 10:48 AM   PHYSICAL THERAPY DISCHARGE SUMMARY  Visits from Start of Care: 4  Current functional level related to goals / functional outcomes: See above.    Remaining deficits: See above.    Education / Equipment: HEP   Patient agrees to discharge. Patient goals were met. Patient is being discharged due to meeting the stated rehab goals. Thank you for the referral. Eulis Foster, PT 07/05/22 10:48 AM

## 2023-12-15 ENCOUNTER — Encounter: Payer: Self-pay | Admitting: Internal Medicine
